# Patient Record
Sex: Female | Born: 1970 | Race: White | Hispanic: No | Marital: Single | Smoking: Former smoker
Health system: Southern US, Community
[De-identification: ages and names within clinical notes are randomized; demographics above are authoritative.]

## PROBLEM LIST (undated history)

## (undated) DIAGNOSIS — I10 Essential (primary) hypertension: Secondary | ICD-10-CM

## (undated) DIAGNOSIS — Z87442 Personal history of urinary calculi: Secondary | ICD-10-CM

## (undated) DIAGNOSIS — N200 Calculus of kidney: Secondary | ICD-10-CM

## (undated) DIAGNOSIS — E785 Hyperlipidemia, unspecified: Secondary | ICD-10-CM

## (undated) DIAGNOSIS — M1990 Unspecified osteoarthritis, unspecified site: Secondary | ICD-10-CM

## (undated) DIAGNOSIS — F419 Anxiety disorder, unspecified: Secondary | ICD-10-CM

## (undated) DIAGNOSIS — T7840XA Allergy, unspecified, initial encounter: Secondary | ICD-10-CM

## (undated) DIAGNOSIS — F329 Major depressive disorder, single episode, unspecified: Secondary | ICD-10-CM

## (undated) DIAGNOSIS — F32A Depression, unspecified: Secondary | ICD-10-CM

## (undated) HISTORY — DX: Depression, unspecified: F32.A

## (undated) HISTORY — DX: Calculus of kidney: N20.0

## (undated) HISTORY — DX: Allergy, unspecified, initial encounter: T78.40XA

## (undated) HISTORY — DX: Essential (primary) hypertension: I10

## (undated) HISTORY — DX: Anxiety disorder, unspecified: F41.9

## (undated) HISTORY — DX: Unspecified osteoarthritis, unspecified site: M19.90

## (undated) HISTORY — PX: TONSILLECTOMY: SUR1361

## (undated) HISTORY — PX: APPENDECTOMY: SHX54

## (undated) HISTORY — DX: Hyperlipidemia, unspecified: E78.5

## (undated) HISTORY — PX: WISDOM TOOTH EXTRACTION: SHX21

## (undated) HISTORY — DX: Major depressive disorder, single episode, unspecified: F32.9

---

## 2008-03-25 LAB — CONVERTED CEMR LAB: Pap Smear: NORMAL

## 2009-04-02 ENCOUNTER — Ambulatory Visit: Payer: Self-pay | Admitting: Family Medicine

## 2009-04-02 DIAGNOSIS — F329 Major depressive disorder, single episode, unspecified: Secondary | ICD-10-CM

## 2009-04-02 DIAGNOSIS — E785 Hyperlipidemia, unspecified: Secondary | ICD-10-CM | POA: Insufficient documentation

## 2009-06-16 ENCOUNTER — Ambulatory Visit: Payer: Self-pay | Admitting: Family Medicine

## 2009-06-16 DIAGNOSIS — R209 Unspecified disturbances of skin sensation: Secondary | ICD-10-CM | POA: Insufficient documentation

## 2010-03-18 ENCOUNTER — Ambulatory Visit: Payer: Self-pay | Admitting: Family Medicine

## 2010-03-18 ENCOUNTER — Other Ambulatory Visit: Admission: RE | Admit: 2010-03-18 | Discharge: 2010-03-18 | Payer: Self-pay | Admitting: Family Medicine

## 2010-03-18 DIAGNOSIS — G4700 Insomnia, unspecified: Secondary | ICD-10-CM | POA: Insufficient documentation

## 2010-03-18 LAB — HM PAP SMEAR

## 2010-11-15 ENCOUNTER — Encounter: Payer: Self-pay | Admitting: Family Medicine

## 2010-11-24 NOTE — Assessment & Plan Note (Signed)
Summary: cpx/pap/pt coming in fasting/cjr   Vital Signs:  Patient profile:   40 year old female Menstrual status:  regular LMP:     03/11/2010 Height:      66 inches Weight:      240 pounds BMI:     38.88 Temp:     98.3 degrees F oral BP sitting:   130 / 88  (left arm) Cuff size:   large  Vitals Entered By: Sid Falcon LPN (Mar 18, 2010 9:01 AM)  Nutrition Counseling: Patient's BMI is greater than 25 and therefore counseled on weight management options. CC: CPX, pap, refills LMP (date): 03/11/2010     Menstrual Status regular Enter LMP: 03/11/2010 Last PAP Result normal   History of Present Illness: Pt here for CPE.  Last pap 2 years ago.  No hx of abnormality.  Tetanus 2010. Remains on OCPs.  No hx DVT.  Quit smoking last year.  No regular exercise. Needs repeat mammogram.-baseline at 34. FH and SH reviewed.  Increased job stress with poor sleep and increased fatigue.  One week cold sxs of nasal cong, cough, and malaise.  Prefers to wait on labs at this time and  would like to schedule these.  Allergies: 1)  Codeine Sulfate (Codeine Sulfate)  Past History:  Past Surgical History: Last updated: 04/02/2009 Appendectomy Tonsillectomy  Family History: Last updated: 03/18/2010 Family History of Arthritis Family History High cholesterol Family History Hypertension Family history Breast cancer, aunt Family history heart disease MGF CAD 84s Family history diabetes, Father  Social History: Last updated: 03/18/2010 Occupation: IT Divorced Current Smoker Alcohol use-yes  occ.  Risk Factors: Smoking Status: current (04/02/2009)  Past Medical History: Depression Hyperlipidemia Hypertension  Family History: Family History of Arthritis Family History High cholesterol Family History Hypertension Family history Breast cancer, aunt Family history heart disease MGF CAD 59s Family history diabetes, Father  Social History: Occupation:  IT Divorced Current Smoker Alcohol use-yes  occ.  Review of Systems  The patient denies anorexia, fever, weight loss, weight gain, vision loss, decreased hearing, chest pain, syncope, dyspnea on exertion, peripheral edema, prolonged cough, headaches, hemoptysis, abdominal pain, melena, hematochezia, severe indigestion/heartburn, hematuria, incontinence, genital sores, muscle weakness, suspicious skin lesions, enlarged lymph nodes, and breast masses.    Physical Exam  General:  Well-developed,well-nourished,in no acute distress; alert,appropriate and cooperative throughout examination Head:  Normocephalic and atraumatic without obvious abnormalities. No apparent alopecia or balding. Eyes:  No corneal or conjunctival inflammation noted. EOMI. Perrla. Funduscopic exam benign, without hemorrhages, exudates or papilledema. Vision grossly normal. Ears:  External ear exam shows no significant lesions or deformities.  Otoscopic examination reveals clear canals, tympanic membranes are intact bilaterally without bulging, retraction, inflammation or discharge. Hearing is grossly normal bilaterally. Mouth:  Oral mucosa and oropharynx without lesions or exudates.  Teeth in good repair. Neck:  No deformities, masses, or tenderness noted. Chest Wall:  No deformities, masses, or tenderness noted. Breasts:  No mass, nodules, thickening, tenderness, bulging, retraction, inflamation, nipple discharge or skin changes noted.   Lungs:  Normal respiratory effort, chest expands symmetrically. Lungs are clear to auscultation, no crackles or wheezes. Heart:  Normal rate and regular rhythm. S1 and S2 normal without gallop, murmur, click, rub or other extra sounds. Abdomen:  Bowel sounds positive,abdomen soft and non-tender without masses, organomegaly or hernias noted. Genitalia:  Normal introitus for age, no external lesions, no vaginal discharge, mucosa pink and moist, no vaginal or cervical lesions, no vaginal  atrophy, no friaility or hemorrhage, normal  uterus size and position, no adnexal masses or tenderness Extremities:  no edema. Neurologic:  alert & oriented X3, cranial nerves II-XII intact, and strength normal in all extremities.   Skin:  no rashes and no suspicious lesions.   Cervical Nodes:  No lymphadenopathy noted Psych:  good eye contact, not anxious appearing, and not depressed appearing.     Impression & Recommendations:  Problem # 1:  Preventive Health Care (ICD-V70.0) worl on weight loss.  Pap smear.  Schedule labs per pt request.  Mammogram.  Problem # 2:  INSOMNIA, CHRONIC (ICD-307.42) sleep hygiene discussed.  Short term as needed use of lorazepam as needed.  Complete Medication List: 1)  Prozac 40 Mg Caps (Fluoxetine hcl) .... Once daily 2)  Ortho-novum 1/35 (28) 1-35 Mg-mcg Tabs (Norethindrone-eth estradiol) .... Daily as directed 3)  Zyrtec Allergy 10 Mg Tabs (Cetirizine hcl) .... Once daily as needed 4)  Womens One Daily Tabs (Multiple vitamins-minerals) .... Once daily 5)  Lorazepam 0.5 Mg Tabs (Lorazepam) .... One by mouth q hs prn  Other Orders: Pap Smear, Thin Prep ( Collection of) ([id number])  Patient Instructions: 1)  It is important that you exercise reguarly at least 20 minutes 5 times a week. If you develop chest pain, have severe difficulty breathing, or feel very tired, stop exercising immediately and seek medical attention.  2)  You need to lose weight. Consider a lower calorie diet and regular exercise.  3)  Schedule your mammogram.  4)  Schedule the following labs:  lipid, bmp, hepatic, TSH, CBC (V70.0) Prescriptions: PROZAC 40 MG CAPS (FLUOXETINE HCL) once daily  #30 x 11   Entered and Authorized by:   Evelena Peat MD   Signed by:   Evelena Peat MD on 03/18/2010   Method used:   Electronically to        [facility] (retail)       [address]       [place], [place]  [address]       Ph: [phone]        Fax: [phone]   RxID:   [id number] ORTHO-NOVUM 1/35 (28) 1-35 MG-MCG TABS (NORETHINDRONE-ETH ESTRADIOL) daily as directed  #28 Tablet x 11   Entered and Authorized by:   Evelena Peat MD   Signed by:   Evelena Peat MD on 03/18/2010   Method used:   Electronically to        [facility] ([facility])       [address]       [place], [place]  [address]       Ph: [phone]       Fax: [phone]   RxID:   [id number] LORAZEPAM 0.5 MG TABS (LORAZEPAM) one by mouth q hs prn  #30 x 3   Entered and Authorized by:   Evelena Peat MD   Signed by:   Evelena Peat MD on 03/18/2010   Method used:   Print then Give to Patient   RxID:   [id number]

## 2010-12-31 ENCOUNTER — Other Ambulatory Visit (HOSPITAL_COMMUNITY): Payer: Self-pay | Admitting: Obstetrics & Gynecology

## 2010-12-31 DIAGNOSIS — Z1231 Encounter for screening mammogram for malignant neoplasm of breast: Secondary | ICD-10-CM

## 2011-01-07 ENCOUNTER — Ambulatory Visit (HOSPITAL_COMMUNITY): Payer: Managed Care, Other (non HMO)

## 2011-01-18 ENCOUNTER — Ambulatory Visit (HOSPITAL_COMMUNITY)
Admission: RE | Admit: 2011-01-18 | Discharge: 2011-01-18 | Disposition: A | Discharge status: Home / Self Care | Payer: Managed Care, Other (non HMO) | Source: Ambulatory Visit | Attending: Obstetrics & Gynecology | Admitting: Obstetrics & Gynecology

## 2011-01-18 DIAGNOSIS — Z1231 Encounter for screening mammogram for malignant neoplasm of breast: Secondary | ICD-10-CM | POA: Insufficient documentation

## 2011-04-05 ENCOUNTER — Other Ambulatory Visit: Payer: Self-pay | Admitting: Family Medicine

## 2011-04-06 ENCOUNTER — Telehealth: Payer: Self-pay | Admitting: *Deleted

## 2011-04-12 NOTE — Telephone Encounter (Signed)
Opened in error

## 2011-04-27 ENCOUNTER — Encounter: Payer: Self-pay | Admitting: Family Medicine

## 2011-04-29 ENCOUNTER — Ambulatory Visit: Payer: Managed Care, Other (non HMO) | Admitting: Family Medicine

## 2011-05-10 ENCOUNTER — Ambulatory Visit (INDEPENDENT_AMBULATORY_CARE_PROVIDER_SITE_OTHER): Payer: BC Managed Care – PPO | Admitting: Family Medicine

## 2011-05-10 ENCOUNTER — Encounter: Payer: Self-pay | Admitting: Family Medicine

## 2011-05-10 VITALS — BP 114/78 | Temp 98.0°F | Ht 66.5 in | Wt 244.0 lb

## 2011-05-10 DIAGNOSIS — F411 Generalized anxiety disorder: Secondary | ICD-10-CM

## 2011-05-10 DIAGNOSIS — F419 Anxiety disorder, unspecified: Secondary | ICD-10-CM

## 2011-05-10 MED ORDER — FLUOXETINE HCL 40 MG PO CAPS: 40.0000 mg | ORAL_CAPSULE | Freq: Every day | ORAL | Status: DC

## 2011-05-10 NOTE — Progress Notes (Signed)
  Subjective:    Patient ID: Gina Vargas, female    DOB: [birth year], 40 y.o.   MRN: [id number]  HPI Medication followup. Fluoxetine 40 mg daily. Has been taking this for 3-4 years. Initially started more for anxiety issues but did have some depressive issues as well. Change of job during the past year and this has reduced her stress levels. She is reluctant to stop fluoxetine. She does not take lorazepam at this time. Recent IUD placement per her gynecologist and this helped regulate her periods.   Review of Systems  Constitutional: Negative for activity change, appetite change and unexpected weight change.  Respiratory: Negative for shortness of breath.   Cardiovascular: Negative for chest pain.  Psychiatric/Behavioral: Negative for confusion, sleep disturbance, dysphoric mood and agitation.       Objective:   Physical Exam  Constitutional: She appears well-developed and well-nourished. No distress.  Cardiovascular: Normal rate, regular rhythm and normal heart sounds.   No murmur heard. Pulmonary/Chest: Effort normal and breath sounds normal. No respiratory distress. She has no wheezes. She has no rales.  Psychiatric: She has a normal mood and affect. Her behavior is normal.          Assessment & Plan:  Chronic anxiety. Stable on fluoxetine 40 mg daily. Discussed possible tapering and patient is reluctant. Medication refilled for one year

## 2011-05-24 ENCOUNTER — Ambulatory Visit (INDEPENDENT_AMBULATORY_CARE_PROVIDER_SITE_OTHER): Payer: BC Managed Care – PPO | Admitting: Family Medicine

## 2011-05-24 ENCOUNTER — Encounter: Payer: Self-pay | Admitting: Family Medicine

## 2011-05-24 VITALS — BP 120/86 | Temp 97.6°F | Wt 243.0 lb

## 2011-05-24 DIAGNOSIS — J019 Acute sinusitis, unspecified: Secondary | ICD-10-CM

## 2011-05-24 MED ORDER — AMOXICILLIN-POT CLAVULANATE 875-125 MG PO TABS: 1.0000 | ORAL_TABLET | Freq: Two times a day (BID) | ORAL | Status: AC

## 2011-05-24 NOTE — Progress Notes (Signed)
  Subjective:    Patient ID: Gina Vargas, female    DOB: 11/24/1970, 40 y.o.   MRN: [id number]  HPI Three-week history of intermittent sinus pressure and malaise. Intermittent sore throat. Cough productive of green sputum. She has bilateral ear pressure. History of frequent otitis media in past. Occasional teeth pain. No relief with over-the-counter medications. Occasional transient vertigo symptoms. No syncope. No orthostasis. Denies any nausea, vomiting, or diarrhea. Has history of frequent allergies and takes Zyrtec regularly   Review of Systems  Constitutional: Positive for fatigue. Negative for fever and chills.  HENT: Positive for congestion, sore throat and sinus pressure. Negative for drooling and mouth sores.   Respiratory: Negative for cough and shortness of breath.   Cardiovascular: Negative for chest pain.       Objective:   Physical Exam  Constitutional: She appears well-developed and well-nourished.  HENT:  Right Ear: External ear normal.  Left Ear: External ear normal.  Mouth/Throat: Oropharynx is clear and moist. No oropharyngeal exudate.       Bilateral turbinate swelling but no purulent discharge  Neck: Neck supple.  Cardiovascular: Normal rate and regular rhythm.   Pulmonary/Chest: Effort normal and breath sounds normal. No respiratory distress. She has no wheezes. She has no rales.  Lymphadenopathy:    She has no cervical adenopathy.          Assessment & Plan:  Acute sinusitis. Augmentin 875 mg twice daily for 10 days No problem-specific assessment & plan notes found for this encounter.

## 2011-05-24 NOTE — Patient Instructions (Signed)

## 2011-06-11 ENCOUNTER — Telehealth: Payer: Self-pay | Admitting: *Deleted

## 2011-06-11 MED ORDER — DOXYCYCLINE HYCLATE 100 MG PO TABS: 100.0000 mg | ORAL_TABLET | Freq: Two times a day (BID) | ORAL | Status: AC

## 2011-06-11 MED ORDER — DOXYCYCLINE HYCLATE 100 MG PO TABS: 100.0000 mg | ORAL_TABLET | Freq: Two times a day (BID) | ORAL | Status: DC

## 2011-06-11 NOTE — Telephone Encounter (Signed)
Doxycycline 100 mg  #30 one twice daily

## 2011-06-11 NOTE — Telephone Encounter (Signed)
Notified pt to pick up meds. 

## 2011-06-11 NOTE — Telephone Encounter (Signed)
Pt finished antibiotics, and symptoms of her sinus infection came right back.  Pressure in ears, teeth pain, and post nasal drip (green).  Is asking for another antibiotic.

## 2012-06-30 ENCOUNTER — Other Ambulatory Visit: Payer: Self-pay | Admitting: *Deleted

## 2012-06-30 MED ORDER — FLUOXETINE HCL 40 MG PO CAPS: 40.0000 mg | ORAL_CAPSULE | Freq: Every day | ORAL | Status: DC

## 2012-07-05 ENCOUNTER — Other Ambulatory Visit: Payer: Self-pay | Admitting: *Deleted

## 2012-07-05 MED ORDER — FLUOXETINE HCL 40 MG PO CAPS: 40.0000 mg | ORAL_CAPSULE | Freq: Every day | ORAL | Status: DC

## 2013-01-05 ENCOUNTER — Other Ambulatory Visit (HOSPITAL_COMMUNITY): Payer: Self-pay | Admitting: Obstetrics & Gynecology

## 2013-01-05 DIAGNOSIS — Z1231 Encounter for screening mammogram for malignant neoplasm of breast: Secondary | ICD-10-CM

## 2013-01-16 ENCOUNTER — Ambulatory Visit (HOSPITAL_COMMUNITY)
Admission: RE | Admit: 2013-01-16 | Discharge: 2013-01-16 | Disposition: A | Discharge status: Home / Self Care | Payer: BC Managed Care – PPO | Source: Ambulatory Visit | Attending: Obstetrics & Gynecology | Admitting: Obstetrics & Gynecology

## 2013-01-16 DIAGNOSIS — Z1231 Encounter for screening mammogram for malignant neoplasm of breast: Secondary | ICD-10-CM | POA: Insufficient documentation

## 2013-02-13 ENCOUNTER — Encounter (HOSPITAL_COMMUNITY): Payer: Self-pay

## 2013-02-13 ENCOUNTER — Emergency Department (HOSPITAL_COMMUNITY)
Admission: EM | Admit: 2013-02-13 | Discharge: 2013-02-13 | Disposition: A | Discharge status: Home / Self Care | Payer: BC Managed Care – PPO | Attending: Emergency Medicine | Admitting: Emergency Medicine

## 2013-02-13 DIAGNOSIS — R51 Headache: Secondary | ICD-10-CM | POA: Insufficient documentation

## 2013-02-13 DIAGNOSIS — R05 Cough: Secondary | ICD-10-CM | POA: Insufficient documentation

## 2013-02-13 DIAGNOSIS — J3489 Other specified disorders of nose and nasal sinuses: Secondary | ICD-10-CM | POA: Insufficient documentation

## 2013-02-13 DIAGNOSIS — F329 Major depressive disorder, single episode, unspecified: Secondary | ICD-10-CM | POA: Insufficient documentation

## 2013-02-13 DIAGNOSIS — Z862 Personal history of diseases of the blood and blood-forming organs and certain disorders involving the immune mechanism: Secondary | ICD-10-CM | POA: Insufficient documentation

## 2013-02-13 DIAGNOSIS — Z79899 Other long term (current) drug therapy: Secondary | ICD-10-CM | POA: Insufficient documentation

## 2013-02-13 DIAGNOSIS — R112 Nausea with vomiting, unspecified: Secondary | ICD-10-CM | POA: Insufficient documentation

## 2013-02-13 DIAGNOSIS — R5381 Other malaise: Secondary | ICD-10-CM | POA: Insufficient documentation

## 2013-02-13 DIAGNOSIS — I10 Essential (primary) hypertension: Secondary | ICD-10-CM | POA: Insufficient documentation

## 2013-02-13 DIAGNOSIS — Z8639 Personal history of other endocrine, nutritional and metabolic disease: Secondary | ICD-10-CM | POA: Insufficient documentation

## 2013-02-13 DIAGNOSIS — J069 Acute upper respiratory infection, unspecified: Secondary | ICD-10-CM

## 2013-02-13 DIAGNOSIS — R509 Fever, unspecified: Secondary | ICD-10-CM | POA: Insufficient documentation

## 2013-02-13 DIAGNOSIS — F3289 Other specified depressive episodes: Secondary | ICD-10-CM | POA: Insufficient documentation

## 2013-02-13 DIAGNOSIS — R059 Cough, unspecified: Secondary | ICD-10-CM | POA: Insufficient documentation

## 2013-02-13 MED ORDER — ONDANSETRON 4 MG PO TBDP: 4.0000 mg | ORAL_TABLET | Freq: Three times a day (TID) | ORAL | Status: DC | PRN

## 2013-02-13 MED ORDER — BENZONATATE 100 MG PO CAPS: 200.0000 mg | ORAL_CAPSULE | Freq: Two times a day (BID) | ORAL | Status: DC | PRN

## 2013-02-13 MED ORDER — IBUPROFEN 800 MG PO TABS
800.0000 mg | ORAL_TABLET | Freq: Once | ORAL | Status: AC
  Administered 2013-02-13: 800 mg via ORAL
  Filled 2013-02-13: qty 1

## 2013-02-13 NOTE — ED Notes (Signed)
Pt c/o back pain, nausea and vomiting. Pt has been ill for several days.

## 2013-02-13 NOTE — ED Provider Notes (Signed)
History     CSN: [id number]  Arrival date & time 02/13/13  2103   First MD Initiated Contact with Patient 02/13/13 2157      Chief Complaint  Patient presents with  . Back Pain    (Consider location/radiation/quality/duration/timing/severity/associated sxs/prior treatment) HPI Comments: 42 y.o. Female with PMHx of HTN, HLD, and seasonal allergies presents with "head cold" symptoms (cough, sinus congestion with associated frontal headache, malaise) that started Monday after spending the weekend with her mom who also had a head cold. Pt states she didn't feel well going to work and gradually worsened by the end of the day when pt felt chilled, feverish, and achy all over (including her lower back). Pt tried Nyquil, Alka Seltzer Cold and Flu, Afrin, and Tylenol. Each provided temporary relief, but sx returned. Today, she woke up with increased malaise, aches, and chills, adding nausea to sx. Normal bowel movements. About 8pm today, pt was afraid she had the flu and wondered if she needed a tamiflu shot so came to what she thought was a walk-in. While here in the ED, pt vomited and stated she felt better after vomiting.   Denies neurological deficits, loss of bowel or bladder control, hx of cancer. Denies sore throat, ear pain, abdominal pain, chest pain, dyspnea, dizziness.   Patient is a 42 y.o. female presenting with back pain.  Back Pain Associated symptoms: fever, headaches and weakness   Associated symptoms: no abdominal pain, no chest pain, no dysuria and no numbness     Past Medical History  Diagnosis Date  . Depression   . Hyperlipidemia   . Hypertension     Past Surgical History  Procedure Laterality Date  . Appendectomy    . Tonsillectomy      Family History  Problem Relation Age of Onset  . Arthritis      fhx  . Hyperlipidemia      fhx  . Hypertension      fhx  . Cancer      fhx  . Coronary artery disease Maternal Grandfather   . Diabetes Father   . Heart  disease      fhx    History  Substance Use Topics  . Smoking status: Never Smoker   . Smokeless tobacco: Not on file  . Alcohol Use: Yes    OB History   Grav Para Term Preterm Abortions TAB SAB Ect Mult Living                  Review of Systems  Constitutional: Positive for fever, chills, appetite change and fatigue. Negative for diaphoresis.  HENT: Positive for sinus pressure. Negative for sore throat, neck pain and neck stiffness.   Eyes: Negative for visual disturbance.  Respiratory: Positive for cough. Negative for apnea, chest tightness and shortness of breath.        Wet cough  Cardiovascular: Negative for chest pain and palpitations.  Gastrointestinal: Positive for nausea and vomiting. Negative for abdominal pain, diarrhea and constipation.       Vomiting 1x, non bloody, non billious  Genitourinary: Negative for dysuria, hematuria and vaginal discharge.  Musculoskeletal: Positive for back pain. Negative for gait problem.  Skin: Negative for rash.  Neurological: Positive for weakness and headaches. Negative for dizziness, light-headedness and numbness.       Frontal headache    Allergies  Codeine sulfate  Home Medications   Current Outpatient Rx  Name  Route  Sig  Dispense  Refill  . cetirizine (ZYRTEC)  10 MG tablet   Oral   Take 10 mg by mouth daily.           [facility] FLUoxetine (PROZAC) 40 MG capsule   Oral   Take 1 capsule (40 mg total) by mouth daily.   30 capsule   0     Pt needs return OV before any more refills, please ...   . Multiple Vitamin (MULTIVITAMIN) tablet   Oral   Take 1 tablet by mouth daily.             BP 137/87  Pulse 115  Resp 24  SpO2 98%  Physical Exam  Nursing note and vitals reviewed. Constitutional: She is oriented to person, place, and time. She appears well-developed and well-nourished. No distress.  HENT:  Head: Normocephalic and atraumatic.  Right Ear: Tympanic membrane normal.  Left Ear: Tympanic membrane normal.   Nose: No sinus tenderness. Right sinus exhibits no maxillary sinus tenderness and no frontal sinus tenderness. Left sinus exhibits no maxillary sinus tenderness and no frontal sinus tenderness.  Mouth/Throat: No oropharyngeal exudate, posterior oropharyngeal edema or posterior oropharyngeal erythema.  Eyes: Conjunctivae and EOM are normal.  Neck: Normal range of motion. Neck supple.  No meningeal signs  Cardiovascular: Normal rate, regular rhythm and normal heart sounds.  Exam reveals no gallop and no friction rub.   No murmur heard. Pulmonary/Chest: Effort normal and breath sounds normal. No respiratory distress. She has no wheezes. She has no rales. She exhibits no tenderness.  Abdominal: Soft. Bowel sounds are normal. She exhibits no distension. There is no tenderness. There is no rebound and no guarding.  Musculoskeletal: Normal range of motion. She exhibits no edema and no tenderness.  Strength 5/5 throughout. Good grip strength. No step offs. No tenderness to midline or to paraspinous muscles.   Neurological: She is alert and oriented to person, place, and time. No cranial nerve deficit.  No focal deficits.   Skin: Skin is warm and dry. She is not diaphoretic. No erythema.  Psychiatric: She has a normal mood and affect.    ED Course  Procedures (including critical care time) Medications  ibuprofen (ADVIL,MOTRIN) tablet 800 mg (800 mg Oral Given 02/13/13 2254)  BP 129/81  Pulse 84  Temp(Src) 99.5 F (37.5 C) (Oral)  Resp 14  SpO2 97%    Labs Reviewed - No data to display No results found.   1. Upper respiratory infection       MDM  42 y.o. Female with PMHx of HTN, HLD, and seasonal allergies presents with "head cold" symptoms (cough, sinus congestion with associated frontal headache, malaise). Physical exam benign for bacterial infection: lungs CTA, TMs normal, throat non-erythematous with no exudates, nares patent without edema. No sinus tenderness on percussion. FROM  extremities and spine. Denies neurological deficits, loss of bowel or bladder control, hx of cancer. Denies sore throat, ear pain, abdominal pain, chest pain, dyspnea, dizziness. Patients symptoms are consistent with URI, likely viral etiology.  11:35 PM On re-evaluation, pt feeling better with administration of ibuprofen. Vitals are WNL, temp decreased to 99.5. Discussed with pt that antibiotics are not indicated for viral infections. Pt will be discharged with symptomatic treatment.  Verbalizes understanding and is agreeable with plan. Pt is hemodynamically stable & in NAD prior to dc.  Glade Nurse, PA-C 02/13/13 2342

## 2013-02-15 NOTE — ED Provider Notes (Signed)
Medical screening examination/treatment/procedure(s) were performed by non-physician practitioner and as supervising physician I was immediately available for consultation/collaboration.   Gwyneth Sprout, MD 02/15/13 2236

## 2013-07-13 ENCOUNTER — Other Ambulatory Visit: Payer: Self-pay | Admitting: Family Medicine

## 2013-07-23 ENCOUNTER — Other Ambulatory Visit: Payer: Self-pay | Admitting: Family Medicine

## 2013-07-26 ENCOUNTER — Encounter: Payer: Self-pay | Admitting: Family Medicine

## 2013-07-26 ENCOUNTER — Ambulatory Visit (INDEPENDENT_AMBULATORY_CARE_PROVIDER_SITE_OTHER): Payer: BC Managed Care – PPO | Admitting: Family Medicine

## 2013-07-26 VITALS — BP 128/84 | HR 77 | Temp 98.2°F | Wt 265.0 lb

## 2013-07-26 DIAGNOSIS — F329 Major depressive disorder, single episode, unspecified: Secondary | ICD-10-CM

## 2013-07-26 DIAGNOSIS — F3289 Other specified depressive episodes: Secondary | ICD-10-CM

## 2013-07-26 MED ORDER — FLUOXETINE HCL 40 MG PO CAPS: 40.0000 mg | ORAL_CAPSULE | Freq: Every day | ORAL | Status: DC

## 2013-07-26 NOTE — Progress Notes (Signed)
  Subjective:    Patient ID: Gina Vargas, female    DOB: [birth year], 42 y.o.   MRN: [id number]  HPI Patient here for followup regarding chronic and recurrent depression. She takes Prozac 40 mg daily has been on this for several years. Over time, she has tried tapering this back multiple occasions without success. She has lots of job stress. For the most part she is sleeping well. She realizes that she needs to lose some weight. She's had good interest in activities and denies any consistent depressed mood. Denies any medication side effects.  Past Medical History  Diagnosis Date  . Depression   . Hyperlipidemia   . Hypertension    Past Surgical History  Procedure Laterality Date  . Appendectomy    . Tonsillectomy      reports that she has never smoked. She does not have any smokeless tobacco history on file. She reports that  drinks alcohol. Her drug history is not on file. family history includes Arthritis in an other family member; Cancer in an other family member; Coronary artery disease in her maternal grandfather; Diabetes in her father; Heart disease in an other family member; Hyperlipidemia in an other family member; Hypertension in an other family member. Allergies  Allergen Reactions  . Codeine Sulfate     REACTION: anxiety      Review of Systems  Constitutional: Negative for appetite change and unexpected weight change.  Respiratory: Negative for shortness of breath.   Cardiovascular: Negative for chest pain.  Psychiatric/Behavioral: Positive for sleep disturbance and dysphoric mood. Negative for suicidal ideas and agitation.       Objective:   Physical Exam  Constitutional: She appears well-developed and well-nourished.  Neck: Neck supple. No thyromegaly present.  Cardiovascular: Normal rate and regular rhythm.   Pulmonary/Chest: Effort normal and breath sounds normal. No respiratory distress. She has no wheezes. She has no rales.  Psychiatric: She has a normal  mood and affect. Her behavior is normal.          Assessment & Plan:  History of recurrent depression. Currently stable. Refill Prozac for one year. We discussed the importance of weight loss. Flu vaccine offered and declined

## 2014-04-02 ENCOUNTER — Other Ambulatory Visit (HOSPITAL_COMMUNITY): Payer: Self-pay | Admitting: Obstetrics & Gynecology

## 2014-04-02 DIAGNOSIS — Z1231 Encounter for screening mammogram for malignant neoplasm of breast: Secondary | ICD-10-CM

## 2014-04-16 ENCOUNTER — Ambulatory Visit (HOSPITAL_COMMUNITY)
Admission: RE | Admit: 2014-04-16 | Discharge: 2014-04-16 | Disposition: A | Discharge status: Home / Self Care | Payer: Managed Care, Other (non HMO) | Source: Ambulatory Visit | Attending: Obstetrics & Gynecology | Admitting: Obstetrics & Gynecology

## 2014-04-16 DIAGNOSIS — Z1231 Encounter for screening mammogram for malignant neoplasm of breast: Secondary | ICD-10-CM | POA: Insufficient documentation

## 2014-08-14 ENCOUNTER — Other Ambulatory Visit: Payer: Self-pay | Admitting: Family Medicine

## 2014-08-16 ENCOUNTER — Telehealth: Payer: Self-pay | Admitting: Family Medicine

## 2014-08-16 MED ORDER — FLUOXETINE HCL 40 MG PO CAPS: 40.0000 mg | ORAL_CAPSULE | Freq: Every day | ORAL | Status: DC

## 2014-08-16 NOTE — Telephone Encounter (Signed)
Rx sent to pharmacy   

## 2014-08-16 NOTE — Telephone Encounter (Signed)
Pt request refill FLUoxetine (PROZAC) 40 MG capsule Pt knows she needs appt and made appt for 11/13, Can you send in 30 days to get her through? [facility]

## 2014-09-06 ENCOUNTER — Encounter: Payer: Self-pay | Admitting: Family Medicine

## 2014-09-06 ENCOUNTER — Ambulatory Visit (INDEPENDENT_AMBULATORY_CARE_PROVIDER_SITE_OTHER): Payer: Managed Care, Other (non HMO) | Admitting: Family Medicine

## 2014-09-06 VITALS — BP 124/84 | HR 64 | Temp 98.2°F | Wt 243.0 lb

## 2014-09-06 DIAGNOSIS — F32A Depression, unspecified: Secondary | ICD-10-CM

## 2014-09-06 DIAGNOSIS — F329 Major depressive disorder, single episode, unspecified: Secondary | ICD-10-CM

## 2014-09-06 DIAGNOSIS — J069 Acute upper respiratory infection, unspecified: Secondary | ICD-10-CM

## 2014-09-06 MED ORDER — FLUOXETINE HCL 40 MG PO CAPS: 40.0000 mg | ORAL_CAPSULE | Freq: Every day | ORAL | Status: DC

## 2014-09-06 NOTE — Patient Instructions (Signed)
Consider complete physical this year.  

## 2014-09-06 NOTE — Progress Notes (Signed)
Pre visit review using our clinic review tool, if applicable. No additional management support is needed unless otherwise documented below in the visit note. 

## 2014-09-06 NOTE — Progress Notes (Signed)
   Subjective:    Patient ID: Gina Vargas, female    DOB: [birth year], 43 y.o.   MRN: [id number]  HPI Patient here for follow-up regarding chronic depression. She's had recurrent depression which is stable for several years on Prozac 40 mg daily. Needing refills. Depression symptoms are stable.  She has acute symptoms of few day history of nasal congestion mild sore throat. Occasional cough. She does have perennial allergies and takes Zyrtec regularly. She is not sure if this represents allergy versus cold. She denies any fever.  Brother just diagnosed with type 2 diabetes. Patient has lost about 20 pounds that she repeat a she's not had a physical in some time.  Past Medical History  Diagnosis Date  . Depression   . Hyperlipidemia   . Hypertension    Past Surgical History  Procedure Laterality Date  . Appendectomy    . Tonsillectomy      reports that she has never smoked. She does not have any smokeless tobacco history on file. She reports that she drinks alcohol. Her drug history is not on file. family history includes Arthritis in an other family member; Cancer in an other family member; Coronary artery disease in her maternal grandfather; Diabetes in her father; Heart disease in an other family member; Hyperlipidemia in an other family member; Hypertension in an other family member. Allergies  Allergen Reactions  . Codeine Sulfate     REACTION: anxiety      Review of Systems  Constitutional: Negative for fatigue.  Eyes: Negative for visual disturbance.  Respiratory: Negative for cough, chest tightness, shortness of breath and wheezing.   Cardiovascular: Negative for chest pain, palpitations and leg swelling.  Endocrine: Negative for polydipsia and polyuria.  Neurological: Negative for dizziness, seizures, syncope, weakness, light-headedness and headaches.       Objective:   Physical Exam  Constitutional: She appears well-developed and well-nourished.  HENT:  Right  Ear: External ear normal.  Left Ear: External ear normal.  Mouth/Throat: Oropharynx is clear and moist.  Neck: Neck supple.  Cardiovascular: Normal rate and regular rhythm.   Pulmonary/Chest: Effort normal and breath sounds normal. No respiratory distress. She has no wheezes. She has no rales.          Assessment & Plan:  #1 history of recurrent depression. Stable. Refill fluoxetine for one year #2 probable viral URI versus allergic rhinitis. She'll continue Zyrtec and add Sudafed. Consider over-the-counter Nasacort or Flonase #3 health maintenance. She is encouraged to schedule complete physical with fasting lab work

## 2014-09-17 ENCOUNTER — Telehealth: Payer: Self-pay | Admitting: Family Medicine

## 2014-09-17 NOTE — Telephone Encounter (Signed)
Pt states she is having some anxiety,  Pt has broken up w/ boyfriend of 6 yrs. Not doing so well.  Pt refused to speak w/ triage, prefers to wait to speak w/ you. Pt aware this is dr's half day and would appreciate this addressed first thing in the am. Pt has been on FLUoxetine (PROZAC) 40 MG capsule long term and would like something else. Pt refused appt b/c she states she was just in 11/13  and has a cpe on 12/7. pls advise

## 2014-09-18 MED ORDER — ALPRAZOLAM 0.5 MG PO TABS: 0.5000 mg | ORAL_TABLET | Freq: Every evening | ORAL | Status: DC | PRN

## 2014-09-18 NOTE — Telephone Encounter (Signed)
Spoke with patient.  Recently broke up with her boyfriend of 6 years. She has history of chronic anxiety and takes Prozac 40 mg daily. She has periods where she has extreme shakiness and sometimes has panic type attacks. She is in the process of setting up counseling. We agreed only very limited alprazolam 0.5 mg #30 ([facility])with no refill until follow-up. She will use this very sparingly for severe episodes.

## 2014-09-18 NOTE — Telephone Encounter (Signed)
Pt is calling back. Pt is aware waiting on MD

## 2014-09-18 NOTE — Telephone Encounter (Signed)
Rx called in to pharmacy. 

## 2014-09-23 ENCOUNTER — Other Ambulatory Visit (INDEPENDENT_AMBULATORY_CARE_PROVIDER_SITE_OTHER): Payer: Managed Care, Other (non HMO)

## 2014-09-23 DIAGNOSIS — Z0000 Encounter for general adult medical examination without abnormal findings: Secondary | ICD-10-CM

## 2014-09-23 LAB — POCT URINALYSIS DIPSTICK
Bilirubin, UA: NEGATIVE
Glucose, UA: NEGATIVE
KETONES UA: NEGATIVE
LEUKOCYTES UA: NEGATIVE
NITRITE UA: NEGATIVE
PH UA: 6
PROTEIN UA: NEGATIVE
RBC UA: NEGATIVE
Spec Grav, UA: <=1.005
Urobilinogen, UA: 0.2

## 2014-09-23 LAB — BASIC METABOLIC PANEL
BUN: 15 mg/dL (ref 6–23)
CO2: 22 mEq/L (ref 19–32)
Calcium: 9.3 mg/dL (ref 8.4–10.5)
Chloride: 102 mEq/L (ref 96–112)
Creatinine, Ser: 1 mg/dL (ref 0.4–1.2)
GFR: 65.29 mL/min (ref >60.00)
Glucose, Bld: 102 mg/dL — ABNORMAL HIGH (ref 70–99)
POTASSIUM: 3.9 meq/L (ref 3.5–5.1)
SODIUM: 135 meq/L (ref 135–145)

## 2014-09-23 LAB — CBC WITH DIFFERENTIAL/PLATELET
Basophils Absolute: 0.1 10*3/uL (ref 0.0–0.1)
Basophils Relative: 0.4 % (ref 0.0–3.0)
EOS PCT: 2 % (ref 0.0–5.0)
Eosinophils Absolute: 0.2 10*3/uL (ref 0.0–0.7)
HEMATOCRIT: 45.9 % (ref 36.0–46.0)
HEMOGLOBIN: 14.9 g/dL (ref 12.0–15.0)
LYMPHS ABS: 2.5 10*3/uL (ref 0.7–4.0)
Lymphocytes Relative: 21.7 % (ref 12.0–46.0)
MCHC: 32.4 g/dL (ref 30.0–36.0)
MCV: 89.4 fl (ref 78.0–100.0)
MONOS PCT: 5.5 % (ref 3.0–12.0)
Monocytes Absolute: 0.6 10*3/uL (ref 0.1–1.0)
NEUTROS ABS: 8.2 10*3/uL — AB (ref 1.4–7.7)
Neutrophils Relative %: 70.4 % (ref 43.0–77.0)
Platelets: 335 10*3/uL (ref 150.0–400.0)
RBC: 5.13 Mil/uL — AB (ref 3.87–5.11)
RDW: 14 % (ref 11.5–15.5)
WBC: 11.7 10*3/uL — AB (ref 4.0–10.5)

## 2014-09-23 LAB — HEPATIC FUNCTION PANEL
ALBUMIN: 4.4 g/dL (ref 3.5–5.2)
ALK PHOS: 90 U/L (ref 39–117)
ALT: 24 U/L (ref 0–35)
AST: 20 U/L (ref 0–37)
Bilirubin, Direct: 0.1 mg/dL (ref 0.0–0.3)
TOTAL PROTEIN: 7.5 g/dL (ref 6.0–8.3)
Total Bilirubin: 0.7 mg/dL (ref 0.2–1.2)

## 2014-09-23 LAB — LIPID PANEL
Cholesterol: 200 mg/dL (ref 0–200)
HDL: 38.3 mg/dL — ABNORMAL LOW (ref >39.00)
LDL CALC: 134 mg/dL — AB (ref 0–99)
NONHDL: 161.7
Total CHOL/HDL Ratio: 5
Triglycerides: 139 mg/dL (ref 0.0–149.0)
VLDL: 27.8 mg/dL (ref 0.0–40.0)

## 2014-09-23 LAB — TSH: TSH: 2.2 u[IU]/mL (ref 0.35–4.50)

## 2014-09-30 ENCOUNTER — Ambulatory Visit (INDEPENDENT_AMBULATORY_CARE_PROVIDER_SITE_OTHER): Payer: Managed Care, Other (non HMO) | Admitting: Family Medicine

## 2014-09-30 ENCOUNTER — Encounter: Payer: Self-pay | Admitting: Family Medicine

## 2014-09-30 VITALS — BP 126/84 | HR 71 | Temp 98.6°F | Ht 66.0 in | Wt 230.0 lb

## 2014-09-30 DIAGNOSIS — E669 Obesity, unspecified: Secondary | ICD-10-CM

## 2014-09-30 DIAGNOSIS — Z0000 Encounter for general adult medical examination without abnormal findings: Secondary | ICD-10-CM

## 2014-09-30 NOTE — Progress Notes (Signed)
Pre visit review using our clinic review tool, if applicable. No additional management support is needed unless otherwise documented below in the visit note. 

## 2014-09-30 NOTE — Patient Instructions (Signed)

## 2014-09-30 NOTE — Progress Notes (Signed)
Subjective:    Patient ID: Gina Vargas, female    DOB: November 21, 1970, 43 y.o.   MRN: [id number]  HPI Patient seen for complete physical. She sees gynecologist regularly. She's had some recent stress issues with having some issues with her boyfriend of 6 years. They are seeking counseling. Overall she is doing fairly well. She does have history of depression and is on Prozac and that is relatively stable. Her brother was diagnosed with type 2 diabetes in the past year. She is getting Pap smears through GYN. She has lost about 20 pounds due to her efforts this year. No consistent exercise but has scaled back calories. Tetanus up-to-date. She declines flu vaccine  Past Medical History  Diagnosis Date  . Depression   . Hyperlipidemia   . Hypertension    Past Surgical History  Procedure Laterality Date  . Appendectomy    . Tonsillectomy      reports that she has never smoked. She does not have any smokeless tobacco history on file. She reports that she drinks alcohol. Her drug history is not on file. family history includes Arthritis in an other family member; Cancer in an other family member; Coronary artery disease in her maternal grandfather; Diabetes in her father; Heart disease in an other family member; Hyperlipidemia in an other family member; Hypertension in an other family member. Allergies  Allergen Reactions  . Codeine Sulfate     REACTION: anxiety      Review of Systems  Constitutional: Negative for fever, activity change, appetite change, fatigue and unexpected weight change.  HENT: Negative for ear pain, hearing loss, sore throat and trouble swallowing.   Eyes: Negative for visual disturbance.  Respiratory: Negative for cough and shortness of breath.   Cardiovascular: Negative for chest pain and palpitations.  Gastrointestinal: Negative for abdominal pain, diarrhea, constipation and blood in stool.  Genitourinary: Negative for dysuria and hematuria.  Musculoskeletal:  Negative for myalgias, back pain and arthralgias.  Skin: Negative for rash.  Neurological: Negative for dizziness, syncope and headaches.  Hematological: Negative for adenopathy.  Psychiatric/Behavioral: Negative for confusion and dysphoric mood.       Objective:   Physical Exam  Constitutional: She is oriented to person, place, and time. She appears well-developed and well-nourished.  HENT:  Head: Normocephalic and atraumatic.  Eyes: EOM are normal. Pupils are equal, round, and reactive to light.  Neck: Normal range of motion. Neck supple. No thyromegaly present.  Cardiovascular: Normal rate, regular rhythm and normal heart sounds.   No murmur heard. Pulmonary/Chest: Breath sounds normal. No respiratory distress. She has no wheezes. She has no rales.  Abdominal: Soft. Bowel sounds are normal. She exhibits no distension and no mass. There is no tenderness. There is no rebound and no guarding.  Musculoskeletal: Normal range of motion. She exhibits no edema.  Lymphadenopathy:    She has no cervical adenopathy.  Neurological: She is alert and oriented to person, place, and time. She displays normal reflexes. No cranial nerve deficit.  Skin: No rash noted.  Psychiatric: She has a normal mood and affect. Her behavior is normal. Judgment and thought content normal.          Assessment & Plan:  Complete physical. Labs reviewed. She has technically prediabetes with glucose 102. We've discussed continued weight loss efforts and establish consistent exercise. She has done an excellent job recently with weight loss of about 20 pounds already.  Recheck fasting blood sugar within one year and consider A1c. Flu shot  offered and declined.

## 2015-04-08 ENCOUNTER — Telehealth: Payer: Self-pay | Admitting: Family Medicine

## 2015-04-08 NOTE — Telephone Encounter (Signed)
Spoke with patient, pt schedule appointment to be seen on 04/09/15 10:00am

## 2015-04-08 NOTE — Telephone Encounter (Signed)
Pt called in to inquire about coming in to see doc about back pains. Asked to speak directly with nurse. Informed pt that I will make a note and she will be contacted.

## 2015-04-09 ENCOUNTER — Encounter: Payer: Self-pay | Admitting: Family Medicine

## 2015-04-09 ENCOUNTER — Ambulatory Visit (INDEPENDENT_AMBULATORY_CARE_PROVIDER_SITE_OTHER): Payer: Managed Care, Other (non HMO) | Admitting: Family Medicine

## 2015-04-09 VITALS — BP 130/80 | HR 78 | Temp 98.4°F | Wt 243.0 lb

## 2015-04-09 DIAGNOSIS — M545 Low back pain, unspecified: Secondary | ICD-10-CM

## 2015-04-09 MED ORDER — CYCLOBENZAPRINE HCL 10 MG PO TABS: 10.0000 mg | ORAL_TABLET | Freq: Three times a day (TID) | ORAL | Status: DC | PRN

## 2015-04-09 MED ORDER — HYDROCODONE-ACETAMINOPHEN 5-325 MG PO TABS: 1.0000 | ORAL_TABLET | Freq: Four times a day (QID) | ORAL | Status: DC | PRN

## 2015-04-09 NOTE — Patient Instructions (Signed)
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
## 2015-04-09 NOTE — Progress Notes (Signed)
Pre visit review using our clinic review tool, if applicable. No additional management support is needed unless otherwise documented below in the visit note. 

## 2015-09-13 ENCOUNTER — Other Ambulatory Visit: Payer: Self-pay | Admitting: Family Medicine

## 2016-09-06 ENCOUNTER — Other Ambulatory Visit: Payer: Self-pay | Admitting: Family Medicine

## 2016-12-08 ENCOUNTER — Ambulatory Visit (INDEPENDENT_AMBULATORY_CARE_PROVIDER_SITE_OTHER): Payer: Managed Care, Other (non HMO) | Admitting: Family Medicine

## 2016-12-08 ENCOUNTER — Encounter: Payer: Self-pay | Admitting: Family Medicine

## 2016-12-08 VITALS — BP 124/90 | HR 90 | Temp 98.5°F | Ht 66.0 in | Wt 257.0 lb

## 2016-12-08 DIAGNOSIS — J019 Acute sinusitis, unspecified: Secondary | ICD-10-CM

## 2016-12-08 MED ORDER — AMOXICILLIN-POT CLAVULANATE 875-125 MG PO TABS: 1.0000 | ORAL_TABLET | Freq: Two times a day (BID) | ORAL | 0 refills | Status: DC

## 2016-12-08 NOTE — Patient Instructions (Signed)
Follow up for any shortness of breath or recurrent fever.

## 2016-12-08 NOTE — Progress Notes (Signed)
Pre visit review using our clinic review tool, if applicable. No additional management support is needed unless otherwise documented below in the visit note. 

## 2016-12-08 NOTE — Progress Notes (Signed)
Subjective:     Patient ID: Gina Vargas, female   DOB: 27-Aug-1971, 46 y.o.   MRN: [id number]  HPI Patient is nonsmoker seen with acute illness. Last week she had some vomiting and diarrhea along with chills and severe body aches. She thought she had flulike illness but really did not have much cough. By Thursday she was some better and over the weekend had more energy and was afebrile. Monday this week she had increased fatigue and increased coughing. Has productive coughing. Progressive maxillary facial pain. No fevers or chills. She's not had any recurrent vomiting or diarrhea. No dyspnea.  Past Medical History:  Diagnosis Date  . Depression   . Hyperlipidemia   . Hypertension    Past Surgical History:  Procedure Laterality Date  . APPENDECTOMY    . TONSILLECTOMY      reports that she has never smoked. She has never used smokeless tobacco. She reports that she drinks alcohol. Her drug history is not on file. family history includes Coronary artery disease in her maternal grandfather; Diabetes in her father. Allergies  Allergen Reactions  . Codeine Sulfate     REACTION: anxiety     Review of Systems  Constitutional: Positive for chills and fatigue.  HENT: Positive for sinus pain and sinus pressure.   Respiratory: Positive for cough.   Neurological: Positive for headaches.       Objective:   Physical Exam  Constitutional: She appears well-developed and well-nourished.  HENT:  Right Ear: External ear normal.  Left Ear: External ear normal.  Mouth/Throat: Oropharynx is clear and moist. No oropharyngeal exudate.  Neck: Neck supple.  Cardiovascular: Normal rate and regular rhythm.   Pulmonary/Chest: Effort normal and breath sounds normal. No respiratory distress. She has no wheezes. She has no rales.  Lymphadenopathy:    She has no cervical adenopathy.       Assessment:     Cough and question of acute sinusitis following what sounds like recent viral illness over week  ago    Plan:     -She's had second wave of illness with worsening after feeling better last weekend. We elected to cover for sinusitis with Augmentin 875 mg twice daily for 10 days. Consider probiotic. -Stay well-hydrated. Follow-up immediately for any increased fever or shortness of breath  Kristian CoveyBruce W Markeshia Giebel MD [facility]

## 2017-04-21 ENCOUNTER — Other Ambulatory Visit: Payer: Self-pay

## 2017-04-21 ENCOUNTER — Telehealth: Payer: Self-pay | Admitting: Family Medicine

## 2017-04-21 ENCOUNTER — Telehealth: Payer: Self-pay

## 2017-04-21 MED ORDER — CYCLOBENZAPRINE HCL 10 MG PO TABS: 10.0000 mg | ORAL_TABLET | Freq: Every day | ORAL | 0 refills | Status: DC

## 2017-04-21 NOTE — Telephone Encounter (Signed)
I left message for patient to return phone call.   

## 2017-04-21 NOTE — Telephone Encounter (Signed)
Pt returning your call

## 2017-04-21 NOTE — Telephone Encounter (Signed)
Patient has diagnosed with left sided low back pain without sciatica by Dr. Caryl NeverBurchette on 04/09/15 - patient was prescribed Flexeril 10mg  at that visit, and patient states it helped very well.  Patient states she was cleaning yesterday and she felt a sharp pain in her lower left back, she states this is the same area as before, and states that she is having a very difficult time getting things done since her pain is very severe. Patient states she can barely even shower and get dressed, and so she refused an appointment in the office, but would like to have a refill on Flexeril. I advised patient that she would need to be evaluated in the office before medication could be prescribed, or she could try UC - patient refused. I advised patient that Dr. Caryl NeverBurchette is out of of the office today, and patient requested that another provider review this and see if Flexeril can be refilled?  Dr. Durene CalHunter, would you mind advising in Dr. Lucie LeatherBurchette's absence?

## 2017-04-21 NOTE — Telephone Encounter (Signed)
See previous note

## 2017-04-21 NOTE — Telephone Encounter (Signed)
Im ok with a 1x refill #30 and advise follow up with Dr. Caryl NeverBurchette if has additional needs

## 2017-04-21 NOTE — Telephone Encounter (Signed)
I contacted patient and notified her of Dr. Erasmo LeventhalHunter's comments. Patient verbalized understanding. Rx has been sent in as directed.

## 2017-07-14 ENCOUNTER — Encounter: Payer: Self-pay | Admitting: Family Medicine

## 2017-09-01 ENCOUNTER — Other Ambulatory Visit: Payer: Self-pay | Admitting: Family Medicine

## 2017-10-11 LAB — HM MAMMOGRAPHY

## 2017-10-28 ENCOUNTER — Ambulatory Visit (INDEPENDENT_AMBULATORY_CARE_PROVIDER_SITE_OTHER): Payer: Managed Care, Other (non HMO) | Admitting: Family Medicine

## 2017-10-28 ENCOUNTER — Encounter: Payer: Self-pay | Admitting: Family Medicine

## 2017-10-28 VITALS — BP 132/94 | HR 74 | Temp 97.9°F | Ht 66.0 in | Wt 261.0 lb

## 2017-10-28 DIAGNOSIS — Z23 Encounter for immunization: Secondary | ICD-10-CM | POA: Diagnosis not present

## 2017-10-28 DIAGNOSIS — Z0000 Encounter for general adult medical examination without abnormal findings: Secondary | ICD-10-CM

## 2017-10-28 MED ORDER — ALPRAZOLAM 0.5 MG PO TABS: ORAL_TABLET | ORAL | 0 refills | Status: DC

## 2017-10-28 MED ORDER — LOSARTAN POTASSIUM 50 MG PO TABS: 50.0000 mg | ORAL_TABLET | Freq: Every day | ORAL | 11 refills | Status: DC

## 2017-10-28 NOTE — Progress Notes (Signed)
Subjective:     Patient ID: Gina Vargas, female   DOB: 1971/06/27, 47 y.o.   MRN: [id number]  HPI Patient seen for physical exam. She is followed regularly by gynecology. She has discussed weight loss strategies recently with her gynecologist. Requesting fasting labs today.  She's had some borderline to slightly elevated blood pressure readings recently with several systolic readings from 130 to 409140 systolic and diastolics from 90. No headaches. No chest pains. No peripheral edema. Plans to start more consistent exercise soon. No regular alcohol use. No nonsteroidal use.  Family history significant for dad and brother with type 2 diabetes. Mother had TIA and CABG at age 48.  Travels frequently with work. Upcoming travel to [place]. She is concerned about difficulties with sleeping on the plane and when she gets there. She's tried Benadryl which did not work well - makes her feel "hyped up ".  Past Medical History:  Diagnosis Date  . Depression   . Hyperlipidemia   . Hypertension    Past Surgical History:  Procedure Laterality Date  . APPENDECTOMY    . TONSILLECTOMY      reports that  has never smoked. she has never used smokeless tobacco. She reports that she drinks alcohol. Her drug history is not on file. family history includes Arthritis in her unknown relative; Cancer in her unknown relative; Coronary artery disease in her maternal grandfather; Diabetes in her father; Heart disease in her unknown relative; Hyperlipidemia in her unknown relative; Hypertension in her unknown relative. Allergies  Allergen Reactions  . Codeine Sulfate     REACTION: anxiety     Review of Systems  Constitutional: Negative for activity change, appetite change, fatigue, fever and unexpected weight change.  HENT: Negative for ear pain, hearing loss, sore throat and trouble swallowing.   Eyes: Negative for visual disturbance.  Respiratory: Negative for cough and shortness of breath.   Cardiovascular:  Negative for chest pain and palpitations.  Gastrointestinal: Negative for abdominal pain, blood in stool, constipation and diarrhea.  Genitourinary: Negative for dysuria and hematuria.  Musculoskeletal: Negative for arthralgias, back pain and myalgias.  Skin: Negative for rash.  Neurological: Negative for dizziness, syncope and headaches.  Hematological: Negative for adenopathy.  Psychiatric/Behavioral: Negative for confusion and dysphoric mood.       Objective:   Physical Exam  Constitutional: She is oriented to person, place, and time. She appears well-developed and well-nourished.  HENT:  Head: Normocephalic and atraumatic.  Eyes: EOM are normal. Pupils are equal, round, and reactive to light.  Neck: Normal range of motion. Neck supple. No thyromegaly present.  Cardiovascular: Normal rate, regular rhythm and normal heart sounds.  No murmur heard. Pulmonary/Chest: Breath sounds normal. No respiratory distress. She has no wheezes. She has no rales.  Abdominal: Soft. Bowel sounds are normal. She exhibits no distension and no mass. There is no tenderness. There is no rebound and no guarding.  Musculoskeletal: Normal range of motion. She exhibits no edema.  Lymphadenopathy:    She has no cervical adenopathy.  Neurological: She is alert and oriented to person, place, and time. She displays normal reflexes. No cranial nerve deficit.  Skin: No rash noted.  Psychiatric: She has a normal mood and affect. Her behavior is normal. Judgment and thought content normal.       Assessment:     Physical exam. Patient has borderline elevated blood pressure and has for quite some time. Upcoming travel with concerns for insomnia and also anxiety with flying  Plan:     -We agreed to limited alprazolam 0.5 mg one hour prior to flight #12 with no refill -Strongly encouraged to lose weight -Obtain screening lab work -Start losartan 50 mg once daily and office follow-up in one month to  reassess -Establish more consistent exercise -Flu vaccine given  Kristian Covey MD [facility] at [facility]

## 2017-10-29 LAB — BASIC METABOLIC PANEL
BUN: 16 mg/dL (ref 7–25)
CHLORIDE: 101 mmol/L (ref 98–110)
CO2: 26 mmol/L (ref 20–32)
CREATININE: 0.77 mg/dL (ref 0.50–1.10)
Calcium: 9.9 mg/dL (ref 8.6–10.2)
Glucose, Bld: 86 mg/dL (ref 65–99)
POTASSIUM: 4.2 mmol/L (ref 3.5–5.3)
Sodium: 137 mmol/L (ref 135–146)

## 2017-10-29 LAB — CBC WITH DIFFERENTIAL/PLATELET
BASOS PCT: 0.4 %
Basophils Absolute: 44 cells/uL (ref 0–200)
EOS ABS: 189 {cells}/uL (ref 15–500)
Eosinophils Relative: 1.7 %
HEMATOCRIT: 43.9 % (ref 35.0–45.0)
Hemoglobin: 14.6 g/dL (ref 11.7–15.5)
Lymphs Abs: 3685 cells/uL (ref 850–3900)
MCH: 28.7 pg (ref 27.0–33.0)
MCHC: 33.3 g/dL (ref 32.0–36.0)
MCV: 86.4 fL (ref 80.0–100.0)
MPV: 10.3 fL (ref 7.5–12.5)
Monocytes Relative: 5.6 %
NEUTROS PCT: 59.1 %
Neutro Abs: 6560 cells/uL (ref 1500–7800)
Platelets: 386 10*3/uL (ref 140–400)
RBC: 5.08 10*6/uL (ref 3.80–5.10)
RDW: 12.8 % (ref 11.0–15.0)
Total Lymphocyte: 33.2 %
WBC: 11.1 10*3/uL — AB (ref 3.8–10.8)
WBCMIX: 622 {cells}/uL (ref 200–950)

## 2017-10-29 LAB — HEPATIC FUNCTION PANEL
AG Ratio: 1.8 (calc) (ref 1.0–2.5)
ALBUMIN MSPROF: 4.6 g/dL (ref 3.6–5.1)
ALT: 21 U/L (ref 6–29)
AST: 19 U/L (ref 10–35)
Alkaline phosphatase (APISO): 85 U/L (ref 33–115)
Bilirubin, Direct: 0.1 mg/dL (ref 0.0–0.2)
GLOBULIN: 2.5 g/dL (ref 1.9–3.7)
Indirect Bilirubin: 0.5 mg/dL (calc) (ref 0.2–1.2)
TOTAL PROTEIN: 7.1 g/dL (ref 6.1–8.1)
Total Bilirubin: 0.6 mg/dL (ref 0.2–1.2)

## 2017-10-29 LAB — LIPID PANEL
CHOL/HDL RATIO: 4.1 (calc) (ref <5.0)
CHOLESTEROL: 228 mg/dL — AB (ref <200)
HDL: 56 mg/dL (ref >50)
LDL CHOLESTEROL (CALC): 150 mg/dL — AB
Non-HDL Cholesterol (Calc): 172 mg/dL (calc) — ABNORMAL HIGH (ref <130)
TRIGLYCERIDES: 108 mg/dL (ref <150)

## 2017-10-29 LAB — HEMOGLOBIN A1C
EAG (MMOL/L): 6.6 (calc)
Hgb A1c MFr Bld: 5.8 % of total Hgb — ABNORMAL HIGH (ref <5.7)
Mean Plasma Glucose: 120 (calc)

## 2017-10-29 LAB — TSH: TSH: 2.98 m[IU]/L

## 2017-10-30 ENCOUNTER — Encounter: Payer: Self-pay | Admitting: Family Medicine

## 2017-11-07 ENCOUNTER — Encounter: Payer: Self-pay | Admitting: Family Medicine

## 2017-12-02 ENCOUNTER — Ambulatory Visit (INDEPENDENT_AMBULATORY_CARE_PROVIDER_SITE_OTHER): Payer: Managed Care, Other (non HMO) | Admitting: Family Medicine

## 2017-12-02 ENCOUNTER — Encounter: Payer: Self-pay | Admitting: Family Medicine

## 2017-12-02 DIAGNOSIS — I10 Essential (primary) hypertension: Secondary | ICD-10-CM

## 2017-12-02 NOTE — Progress Notes (Signed)
Subjective:     Patient ID: Gina Vargas, female   DOB: Sep 18, 1971, 47 y.o.   MRN: [id number]  HPI Here for follow up hypertension.  Was seen for CPE recently.  Several week hx of readings > 140 systolic and > 90 diastolic.  We started Losartan 50 mg once daily.  Felt better after about 4 days.  She is very pleased with the medication.  She has started a weight loss program with another practice and is taking Qsymia at low dose. Also starting Weight Watchers. She had come off blood pressure medication eventually also plans to start diligent exercise program soon  Past Medical History:  Diagnosis Date  . Depression   . Hyperlipidemia   . Hypertension    Past Surgical History:  Procedure Laterality Date  . APPENDECTOMY    . TONSILLECTOMY      reports that  has never smoked. she has never used smokeless tobacco. She reports that she drinks alcohol. Her drug history is not on file. family history includes Arthritis in her unknown relative; Cancer in her unknown relative; Coronary artery disease in her maternal grandfather; Diabetes in her father; Heart disease in her unknown relative; Hyperlipidemia in her unknown relative; Hypertension in her unknown relative. Allergies  Allergen Reactions  . Codeine Sulfate     REACTION: anxiety     Review of Systems  Constitutional: Negative for fatigue.  Eyes: Negative for visual disturbance.  Respiratory: Negative for cough, chest tightness, shortness of breath and wheezing.   Cardiovascular: Negative for chest pain, palpitations and leg swelling.  Neurological: Negative for dizziness, seizures, syncope, weakness, light-headedness and headaches.       Objective:   Physical Exam  Constitutional: She appears well-developed and well-nourished.  Eyes: Pupils are equal, round, and reactive to light.  Neck: Neck supple. No JVD present. No thyromegaly present.  Cardiovascular: Normal rate and regular rhythm. Exam reveals no gallop.   Pulmonary/Chest: Effort normal and breath sounds normal. No respiratory distress. She has no wheezes. She has no rales.  Musculoskeletal: She exhibits no edema.  Neurological: She is alert.       Assessment:     Hypertension-greatly improved and patient feeling much better overall    Plan:     -Continue losartan 50 mg once daily. We discussed the fact that we may be on taper her off this in the next several months if she continues with her weight loss efforts and exercise.  Kristian CoveyBruce W Burchette MD [facility]

## 2018-03-27 ENCOUNTER — Other Ambulatory Visit: Payer: Managed Care, Other (non HMO)

## 2018-03-27 ENCOUNTER — Other Ambulatory Visit: Payer: Self-pay | Admitting: Family Medicine

## 2018-04-03 ENCOUNTER — Ambulatory Visit: Payer: Managed Care, Other (non HMO) | Admitting: Family Medicine

## 2018-08-06 ENCOUNTER — Other Ambulatory Visit: Payer: Self-pay | Admitting: Family Medicine

## 2018-10-03 ENCOUNTER — Other Ambulatory Visit: Payer: Self-pay | Admitting: Family Medicine

## 2018-10-19 LAB — HM MAMMOGRAPHY

## 2018-11-06 ENCOUNTER — Other Ambulatory Visit: Payer: Self-pay | Admitting: Family Medicine

## 2018-12-04 ENCOUNTER — Other Ambulatory Visit: Payer: Self-pay | Admitting: Family Medicine

## 2018-12-11 ENCOUNTER — Other Ambulatory Visit: Payer: Self-pay

## 2018-12-11 ENCOUNTER — Encounter: Payer: Self-pay | Admitting: Family Medicine

## 2018-12-11 ENCOUNTER — Ambulatory Visit (INDEPENDENT_AMBULATORY_CARE_PROVIDER_SITE_OTHER): Payer: Managed Care, Other (non HMO) | Admitting: Family Medicine

## 2018-12-11 VITALS — BP 128/84 | HR 70 | Temp 98.3°F | Ht 65.5 in | Wt 237.4 lb

## 2018-12-11 DIAGNOSIS — E669 Obesity, unspecified: Secondary | ICD-10-CM

## 2018-12-11 DIAGNOSIS — E785 Hyperlipidemia, unspecified: Secondary | ICD-10-CM

## 2018-12-11 DIAGNOSIS — Z23 Encounter for immunization: Secondary | ICD-10-CM

## 2018-12-11 DIAGNOSIS — I10 Essential (primary) hypertension: Secondary | ICD-10-CM | POA: Diagnosis not present

## 2018-12-11 DIAGNOSIS — F32A Depression, unspecified: Secondary | ICD-10-CM

## 2018-12-11 DIAGNOSIS — F329 Major depressive disorder, single episode, unspecified: Secondary | ICD-10-CM | POA: Diagnosis not present

## 2018-12-11 MED ORDER — FLUOXETINE HCL 40 MG PO CAPS: ORAL_CAPSULE | ORAL | 3 refills | Status: DC

## 2018-12-11 MED ORDER — LOSARTAN POTASSIUM 50 MG PO TABS: 50.0000 mg | ORAL_TABLET | Freq: Every day | ORAL | 3 refills | Status: DC

## 2018-12-11 NOTE — Addendum Note (Signed)
Addended by: Faustino Congress L on: 12/11/2018 10:14 AM   Modules accepted: Orders

## 2018-12-11 NOTE — Progress Notes (Signed)
Subjective:     Patient ID: Gina Vargas, female   DOB: 1970/12/29, 48 y.o.   MRN: [id number]  HPI  Patient in for medical follow-up.  She needs some refills.  She has history of obesity.  Her OB placed her on Q Samia last year and she lost around 40 pounds but then plateaued.  She is now on phentermine but has had some insomnia and has not taken this consistently more than 2 nights continuous.  Currently participating with weight watchers  Her weight is still down about 20 pounds from last year.  She has hypertension and was started last on losartan 50 mg daily.  Her blood pressures been stable.  No headaches or dizziness.  She has history of hyperlipidemia.  She would like to lose about 10 or 15 more pounds and then recheck her fasting lipids.  She has no chronic recurrent depression and remains on Prozac 40 mg daily.  She has been on this for several years and reluctant to stop.  Depression symptoms are currently stable  Past Medical History:  Diagnosis Date  . Depression   . Hyperlipidemia   . Hypertension    Past Surgical History:  Procedure Laterality Date  . APPENDECTOMY    . TONSILLECTOMY      reports that she has never smoked. She has never used smokeless tobacco. She reports current alcohol use. She reports that she does not use drugs. family history includes Arthritis in an other family member; Cancer in an other family member; Coronary artery disease in her maternal grandfather; Diabetes in her father; Heart disease in an other family member; Hyperlipidemia in an other family member; Hypertension in an other family member. Allergies  Allergen Reactions  . Codeine Sulfate     REACTION: anxiety    Review of Systems  Constitutional: Negative for fatigue and unexpected weight change.  Eyes: Negative for visual disturbance.  Respiratory: Negative for cough, chest tightness, shortness of breath and wheezing.   Cardiovascular: Negative for chest pain, palpitations and leg  swelling.  Endocrine: Negative for polydipsia and polyuria.  Neurological: Negative for dizziness, seizures, syncope, weakness, light-headedness and headaches.       Objective:   Physical Exam Constitutional:      Appearance: She is well-developed.  Eyes:     Pupils: Pupils are equal, round, and reactive to light.  Neck:     Musculoskeletal: Neck supple.     Thyroid: No thyromegaly.     Vascular: No JVD.  Cardiovascular:     Rate and Rhythm: Normal rate and regular rhythm.     Heart sounds: No gallop.   Pulmonary:     Effort: Pulmonary effort is normal. No respiratory distress.     Breath sounds: Normal breath sounds. No wheezing or rales.  Musculoskeletal:     Right lower leg: No edema.     Left lower leg: No edema.  Neurological:     Mental Status: She is alert.        Assessment:     #1 hypertension stable and at goal.  Repeat blood pressure after rest 120/78  #2 history of recurrent depression currently stable on fluoxetine  #3 obesity.  She has had some weight loss with Qsymia and weight watchers.  Currently on phentermine but not taking consistently  #4 hyperlipidemia    Plan:     -Refilled fluoxetine and losartan for 1 year -Patient will consider getting fasting lipids in 3 or 4 months after some additional weight loss -  She is considering going back on Q-symia if she continues to have insomnia with phentermine. -Flu vaccine given -She needs tetanus but wants to return later for that  Kristian Covey MD [facility] at [facility]

## 2018-12-15 ENCOUNTER — Other Ambulatory Visit: Payer: Self-pay | Admitting: Family Medicine

## 2018-12-18 NOTE — Telephone Encounter (Signed)
Please see message in yellow. Please advise if okay and quantity and refill amount. Thanks!  Last OV 12/11/18

## 2018-12-18 NOTE — Telephone Encounter (Signed)
May refill #90 with one refill. 

## 2019-02-08 ENCOUNTER — Encounter: Payer: Self-pay | Admitting: Family Medicine

## 2019-02-08 ENCOUNTER — Ambulatory Visit: Payer: Self-pay | Admitting: Family Medicine

## 2019-02-08 ENCOUNTER — Other Ambulatory Visit: Payer: Self-pay

## 2019-02-08 ENCOUNTER — Ambulatory Visit (INDEPENDENT_AMBULATORY_CARE_PROVIDER_SITE_OTHER): Payer: Managed Care, Other (non HMO) | Admitting: Family Medicine

## 2019-02-08 VITALS — Temp 97.7°F | Wt 244.6 lb

## 2019-02-08 DIAGNOSIS — R1030 Lower abdominal pain, unspecified: Secondary | ICD-10-CM | POA: Diagnosis not present

## 2019-02-08 LAB — CBC WITH DIFFERENTIAL/PLATELET
Basophils Absolute: 0.1 10*3/uL (ref 0.0–0.1)
Basophils Relative: 0.4 % (ref 0.0–3.0)
Eosinophils Absolute: 0.2 10*3/uL (ref 0.0–0.7)
Eosinophils Relative: 1.6 % (ref 0.0–5.0)
HCT: 43.2 % (ref 36.0–46.0)
Hemoglobin: 14.3 g/dL (ref 12.0–15.0)
Lymphocytes Relative: 19.1 % (ref 12.0–46.0)
Lymphs Abs: 2.7 10*3/uL (ref 0.7–4.0)
MCHC: 33.2 g/dL (ref 30.0–36.0)
MCV: 90.1 fl (ref 78.0–100.0)
Monocytes Absolute: 0.9 10*3/uL (ref 0.1–1.0)
Monocytes Relative: 6.5 % (ref 3.0–12.0)
Neutro Abs: 10.3 10*3/uL — ABNORMAL HIGH (ref 1.4–7.7)
Neutrophils Relative %: 72.4 % (ref 43.0–77.0)
Platelets: 371 10*3/uL (ref 150.0–400.0)
RBC: 4.79 Mil/uL (ref 3.87–5.11)
RDW: 13.8 % (ref 11.5–15.5)
WBC: 14.3 10*3/uL — ABNORMAL HIGH (ref 4.0–10.5)

## 2019-02-08 LAB — POCT URINALYSIS DIPSTICK
Bilirubin, UA: NEGATIVE
Blood, UA: NEGATIVE
Glucose, UA: NEGATIVE
Ketones, UA: NEGATIVE
Leukocytes, UA: NEGATIVE
Nitrite, UA: NEGATIVE
Odor: NEGATIVE
Protein, UA: NEGATIVE
Spec Grav, UA: 1.015 (ref 1.010–1.025)
Urobilinogen, UA: 0.2 E.U./dL
pH, UA: 6.5 (ref 5.0–8.0)

## 2019-02-08 NOTE — Telephone Encounter (Signed)
LM for call back - needs virtual visit

## 2019-02-08 NOTE — Telephone Encounter (Signed)
Patient already on schedule this morning at 9:15am.

## 2019-02-08 NOTE — Telephone Encounter (Signed)
Pt. Reports she started having abdominal pain and bloating Tuesday night. Has gas and bloating. Pain 5/10. Does have issues with constipation. Last Answer Assessment - Initial Assessment Questions 1. LOCATION: "Where does it hurt?"      At the naval and below 2. RADIATION: "Does the pain shoot anywhere else?" (e.g., chest, back)     Unsure 3. ONSET: "When did the pain begin?" (e.g., minutes, hours or days ago)      Started Tuesday night 4. SUDDEN: "Gradual or sudden onset?"     Gradual 5. PATTERN "Does the pain come and go, or is it constant?"    - If constant: "Is it getting better, staying the same, or worsening?"      (Note: Constant means the pain never goes away completely; most serious pain is constant and it progresses)     - If intermittent: "How long does it last?" "Do you have pain now?"     (Note: Intermittent means the pain goes away completely between bouts)     Constant 6. SEVERITY: "How bad is the pain?"  (e.g., Scale 1-10; mild, moderate, or severe)   - MILD (1-3): doesn't interfere with normal activities, abdomen soft and not tender to touch    - MODERATE (4-7): interferes with normal activities or awakens from sleep, tender to touch    - SEVERE (8-10): excruciating pain, doubled over, unable to do any normal activities      5-6 7. RECURRENT SYMPTOM: "Have you ever had this type of abdominal pain before?" If so, ask: "When was the last time?" and "What happened that time?"      No 8. CAUSE: "What do you think is causing the abdominal pain?"     Took some detox powder - Monday and Tuesday. 9. RELIEVING/AGGRAVATING FACTORS: "What makes it better or worse?" (e.g., movement, antacids, bowel movement)     No 10. OTHER SYMPTOMS: "Has there been any vomiting, diarrhea, constipation, or urine problems?"       Does have problems with constipation 11. PREGNANCY: "Is there any chance you are pregnant?" "When was your last menstrual period?"       No  Protocols used: ABDOMINAL  PAIN - [facility]

## 2019-02-08 NOTE — Progress Notes (Signed)
Subjective:     Patient ID: Gina Vargas, female   DOB: 1971-09-23, 48 y.o.   MRN: [id number]  HPI Patient is seen for abdominal pain.  She initially was contacted for video visit this morning but then we decided to convert this to an office assessment.  She states that current symptoms started couple days ago.  She recalls eating some hummis on Monday and Tuesday.  She thought she initially had some gas-like pains related to that.  She recalls having bowel movement on Tuesday and Wednesday which were fairly normal.  She developed some cramp-like pains periumbilical and just inferior to this region somewhat bilateral on Tuesday.  She has had some intermittent bloating.  No dysuria  She states that she normally runs around 97 F and had temperature 99.1 yesterday.  This was back down to 98.6 today.  She has had some nonspecific symptoms of headache and fatigue.  No cough.  No dyspnea.  No urinary symptoms.  She had some transient nausea yesterday but no vomiting.  No nausea today.  No diarrhea.  Had appendectomy about 15 years ago.  No exacerbating or alleviating factors other than pain slightly exacerbated with walking.  She has had increased appetite this morning.  No history of known diverticular disease.  Denies any upper abdominal pain  Past Medical History:  Diagnosis Date  . Depression   . Hyperlipidemia   . Hypertension    Past Surgical History:  Procedure Laterality Date  . APPENDECTOMY    . TONSILLECTOMY      reports that she has never smoked. She has never used smokeless tobacco. She reports current alcohol use. She reports that she does not use drugs. family history includes Arthritis in an other family member; Cancer in an other family member; Coronary artery disease in her maternal grandfather; Diabetes in her father; Heart disease in an other family member; Hyperlipidemia in an other family member; Hypertension in an other family member. Allergies  Allergen Reactions  .  Codeine Sulfate     REACTION: anxiety     Review of Systems  Constitutional: Positive for fatigue and fever. Negative for chills.  Respiratory: Negative for cough, shortness of breath and wheezing.   Cardiovascular: Negative for chest pain.  Gastrointestinal: Positive for abdominal pain. Negative for blood in stool, constipation, diarrhea and vomiting.  Genitourinary: Negative for dysuria.  Musculoskeletal: Negative for back pain.  Hematological: Negative for adenopathy.       Objective:   Physical Exam Constitutional:      Appearance: Normal appearance.  Cardiovascular:     Rate and Rhythm: Normal rate and regular rhythm.  Pulmonary:     Effort: Pulmonary effort is normal.     Breath sounds: Normal breath sounds.  Abdominal:     General: Bowel sounds are normal. There is no distension.     Palpations: Abdomen is soft.     Comments: She has some mild to moderate tenderness bilaterally left and right lower quadrants.  No guarding.  No hepatomegaly or splenomegaly  Skin:    Findings: No rash.  Neurological:     Mental Status: She is alert.        Assessment:     Patient presents with 2-day history of somewhat diffuse abdominal pain somewhat bilateral.  Prior appendectomy.  Nonacute abdomen.  She did report somewhat elevated temperature yesterday though no true fever.  She is afebrile now    Plan:     -Check urinalysis and stat CBC -Recommend bland  diet for now. -Follow-up promptly for any progressive abdominal pain, fever, vomiting, or other concerns -Consider CT abdomen and pelvis for any progressive pain,recurrent fever, or if significantly elevated white blood count  Kristian CoveyBruce W Angelica Frandsen MD [facility]

## 2019-02-08 NOTE — Telephone Encounter (Signed)
Last bowel movement was yesterday. Reports she did take a powdered detox Monday and Tuesday. Warm transfer into the practice with [facility] for an appointment.

## 2019-02-08 NOTE — Patient Instructions (Signed)
Abdominal Pain, Adult  Abdominal pain can be caused by many things. Often, abdominal pain is not serious and it gets better with no treatment or by being treated at home. However, sometimes abdominal pain is serious. Your health care provider will do a medical history and a physical exam to try to determine the cause of your abdominal pain.  Follow these instructions at home:   Take over-the-counter and prescription medicines only as told by your health care provider. Do not take a laxative unless told by your health care provider.   Drink enough fluid to keep your urine clear or pale yellow.   Watch your condition for any changes.   Keep all follow-up visits as told by your health care provider. This is important.  Contact a health care provider if:   Your abdominal pain changes or gets worse.   You are not hungry or you lose weight without trying.   You are constipated or have diarrhea for more than 2-3 days.   You have pain when you urinate or have a bowel movement.   Your abdominal pain wakes you up at night.   Your pain gets worse with meals, after eating, or with certain foods.   You are throwing up and cannot keep anything down.   You have a fever.  Get help right away if:   Your pain does not go away as soon as your health care provider told you to expect.   You cannot stop throwing up.   Your pain is only in areas of the abdomen, such as the right side or the left lower portion of the abdomen.   You have bloody or black stools, or stools that look like tar.   You have severe pain, cramping, or bloating in your abdomen.   You have signs of dehydration, such as:  ? Dark urine, very little urine, or no urine.  ? Cracked lips.  ? Dry mouth.  ? Sunken eyes.  ? Sleepiness.  ? Weakness.  This information is not intended to replace advice given to you by your health care provider. Make sure you discuss any questions you have with your health care provider.  Document Released: 07/21/2005 Document  Revised: 04/30/2016 Document Reviewed: 03/24/2016  [employer]vier Interactive Patient Education  2019 [employer]

## 2019-06-09 ENCOUNTER — Emergency Department (HOSPITAL_COMMUNITY): Payer: Managed Care, Other (non HMO)

## 2019-06-09 ENCOUNTER — Other Ambulatory Visit: Payer: Self-pay

## 2019-06-09 ENCOUNTER — Emergency Department (HOSPITAL_COMMUNITY)
Admission: EM | Admit: 2019-06-09 | Discharge: 2019-06-09 | Disposition: A | Discharge status: Home / Self Care | Payer: Managed Care, Other (non HMO) | Attending: Emergency Medicine | Admitting: Emergency Medicine

## 2019-06-09 ENCOUNTER — Encounter (HOSPITAL_COMMUNITY): Payer: Self-pay

## 2019-06-09 DIAGNOSIS — R51 Headache: Secondary | ICD-10-CM | POA: Diagnosis not present

## 2019-06-09 DIAGNOSIS — S20312A Abrasion of left front wall of thorax, initial encounter: Secondary | ICD-10-CM | POA: Diagnosis not present

## 2019-06-09 DIAGNOSIS — Y93I9 Activity, other involving external motion: Secondary | ICD-10-CM | POA: Diagnosis not present

## 2019-06-09 DIAGNOSIS — Y999 Unspecified external cause status: Secondary | ICD-10-CM | POA: Diagnosis not present

## 2019-06-09 DIAGNOSIS — Y92410 Unspecified street and highway as the place of occurrence of the external cause: Secondary | ICD-10-CM | POA: Diagnosis not present

## 2019-06-09 DIAGNOSIS — R0789 Other chest pain: Secondary | ICD-10-CM | POA: Insufficient documentation

## 2019-06-09 DIAGNOSIS — R109 Unspecified abdominal pain: Secondary | ICD-10-CM | POA: Diagnosis not present

## 2019-06-09 DIAGNOSIS — S30811A Abrasion of abdominal wall, initial encounter: Secondary | ICD-10-CM | POA: Insufficient documentation

## 2019-06-09 DIAGNOSIS — S40812A Abrasion of left upper arm, initial encounter: Secondary | ICD-10-CM | POA: Diagnosis not present

## 2019-06-09 DIAGNOSIS — Z23 Encounter for immunization: Secondary | ICD-10-CM | POA: Diagnosis not present

## 2019-06-09 DIAGNOSIS — T07XXXA Unspecified multiple injuries, initial encounter: Secondary | ICD-10-CM

## 2019-06-09 DIAGNOSIS — Z79899 Other long term (current) drug therapy: Secondary | ICD-10-CM | POA: Insufficient documentation

## 2019-06-09 DIAGNOSIS — I10 Essential (primary) hypertension: Secondary | ICD-10-CM | POA: Insufficient documentation

## 2019-06-09 DIAGNOSIS — M79645 Pain in left finger(s): Secondary | ICD-10-CM

## 2019-06-09 LAB — BASIC METABOLIC PANEL
Anion gap: 9 (ref 5–15)
BUN: 21 mg/dL — ABNORMAL HIGH (ref 6–20)
CO2: 25 mmol/L (ref 22–32)
Calcium: 10 mg/dL (ref 8.9–10.3)
Chloride: 104 mmol/L (ref 98–111)
Creatinine, Ser: 0.82 mg/dL (ref 0.44–1.00)
GFR calc Af Amer: >60 mL/min (ref >60)
GFR calc non Af Amer: >60 mL/min (ref >60)
Glucose, Bld: 109 mg/dL — ABNORMAL HIGH (ref 70–99)
Potassium: 4.1 mmol/L (ref 3.5–5.1)
Sodium: 138 mmol/L (ref 135–145)

## 2019-06-09 LAB — CBC WITH DIFFERENTIAL/PLATELET
Abs Immature Granulocytes: 0.2 10*3/uL — ABNORMAL HIGH (ref 0.00–0.07)
Basophils Absolute: 0.1 10*3/uL (ref 0.0–0.1)
Basophils Relative: 0 %
Eosinophils Absolute: 0.1 10*3/uL (ref 0.0–0.5)
Eosinophils Relative: 0 %
HCT: 48.8 % — ABNORMAL HIGH (ref 36.0–46.0)
Hemoglobin: 15.5 g/dL — ABNORMAL HIGH (ref 12.0–15.0)
Immature Granulocytes: 1 %
Lymphocytes Relative: 12 %
Lymphs Abs: 2.4 10*3/uL (ref 0.7–4.0)
MCH: 29.8 pg (ref 26.0–34.0)
MCHC: 31.8 g/dL (ref 30.0–36.0)
MCV: 93.7 fL (ref 80.0–100.0)
Monocytes Absolute: 0.9 10*3/uL (ref 0.1–1.0)
Monocytes Relative: 5 %
Neutro Abs: 15.6 10*3/uL — ABNORMAL HIGH (ref 1.7–7.7)
Neutrophils Relative %: 82 %
Platelets: 394 10*3/uL (ref 150–400)
RBC: 5.21 MIL/uL — ABNORMAL HIGH (ref 3.87–5.11)
RDW: 13.4 % (ref 11.5–15.5)
WBC: 19.2 10*3/uL — ABNORMAL HIGH (ref 4.0–10.5)
nRBC: 0 % (ref 0.0–0.2)

## 2019-06-09 LAB — I-STAT BETA HCG BLOOD, ED (NOT ORDERABLE): I-stat hCG, quantitative: <5 m[IU]/mL (ref <5)

## 2019-06-09 MED ORDER — SODIUM CHLORIDE (PF) 0.9 % IJ SOLN
INTRAMUSCULAR | Status: AC
  Filled 2019-06-09: qty 50

## 2019-06-09 MED ORDER — METHOCARBAMOL 500 MG PO TABS: 500.0000 mg | ORAL_TABLET | Freq: Two times a day (BID) | ORAL | 0 refills | Status: DC

## 2019-06-09 MED ORDER — MORPHINE SULFATE (PF) 4 MG/ML IV SOLN: 4.0000 mg | Freq: Once | INTRAVENOUS | Status: DC

## 2019-06-09 MED ORDER — TETANUS-DIPHTH-ACELL PERTUSSIS 5-2.5-18.5 LF-MCG/0.5 IM SUSP
0.5000 mL | Freq: Once | INTRAMUSCULAR | Status: AC
  Administered 2019-06-09: 0.5 mL via INTRAMUSCULAR
  Filled 2019-06-09: qty 0.5

## 2019-06-09 MED ORDER — IOHEXOL 300 MG/ML  SOLN
100.0000 mL | Freq: Once | INTRAMUSCULAR | Status: AC | PRN
  Administered 2019-06-09: 14:00:00 100 mL via INTRAVENOUS

## 2019-06-09 NOTE — ED Notes (Signed)
Patient provided with ice pack
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
## 2019-06-09 NOTE — ED Notes (Signed)
Family at bedside. 

## 2019-06-09 NOTE — ED Notes (Signed)
Lab at bedside
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
## 2019-06-09 NOTE — ED Notes (Signed)
Wound cleaned and dressing applied.  
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
## 2019-06-09 NOTE — ED Notes (Signed)
Patient transported to CT 

## 2019-11-23 ENCOUNTER — Encounter: Payer: Self-pay | Admitting: Family Medicine

## 2019-11-27 ENCOUNTER — Encounter: Payer: Self-pay | Admitting: Gastroenterology

## 2019-12-03 ENCOUNTER — Encounter: Payer: Self-pay | Admitting: Family Medicine

## 2019-12-24 ENCOUNTER — Other Ambulatory Visit: Payer: Self-pay | Admitting: Family Medicine

## 2019-12-24 NOTE — Telephone Encounter (Signed)
Refill for one month    needs follow up soon.

## 2019-12-28 ENCOUNTER — Encounter: Payer: Managed Care, Other (non HMO) | Admitting: Gastroenterology

## 2020-01-17 ENCOUNTER — Other Ambulatory Visit: Payer: Self-pay | Admitting: Family Medicine

## 2020-01-17 NOTE — Telephone Encounter (Signed)
Refill for 3 months.  Needs follow up soon.

## 2020-01-24 ENCOUNTER — Other Ambulatory Visit: Payer: Self-pay

## 2020-01-24 ENCOUNTER — Ambulatory Visit (AMBULATORY_SURGERY_CENTER): Payer: Self-pay | Admitting: *Deleted

## 2020-01-24 VITALS — Temp 97.5°F | Ht 65.5 in | Wt 252.2 lb

## 2020-01-24 DIAGNOSIS — Z1211 Encounter for screening for malignant neoplasm of colon: Secondary | ICD-10-CM

## 2020-01-24 DIAGNOSIS — Z01818 Encounter for other preprocedural examination: Secondary | ICD-10-CM

## 2020-01-24 NOTE — Progress Notes (Signed)
Pt takes smooth move as needed  Pt states she vomited after wisdom teeth removal and appendectomy  No intubation problems or fam hx/hx of malignant hyperthermia  Pt is aware that care partner will wait in the car during procedure; if they feel like they will be too hot or cold to wait in the car; they may wait in the 4 th floor lobby. Patient is aware to bring only one care partner. We want them to wear a mask (we do not have any that we can provide them), practice social distancing, and we will check their temperatures when they get here.  I did remind the patient that their care partner needs to stay in the parking lot the entire time and have a cell phone available, we will call them when the pt is ready for discharge. Patient will wear mask into building.   No egg or soy allergy  No home oxygen use  Pt told to stop Qsymia on 01-29-20 emmi information given   covid test 02-05-20 at 8:15 am

## 2020-02-08 ENCOUNTER — Encounter: Payer: Managed Care, Other (non HMO) | Admitting: Gastroenterology

## 2020-03-12 ENCOUNTER — Other Ambulatory Visit: Payer: Self-pay | Admitting: Family Medicine

## 2020-03-31 ENCOUNTER — Encounter: Payer: Self-pay | Admitting: Family Medicine

## 2020-03-31 ENCOUNTER — Other Ambulatory Visit: Payer: Self-pay

## 2020-03-31 ENCOUNTER — Ambulatory Visit (INDEPENDENT_AMBULATORY_CARE_PROVIDER_SITE_OTHER): Payer: Managed Care, Other (non HMO) | Admitting: Family Medicine

## 2020-03-31 VITALS — BP 126/62 | HR 87 | Temp 97.9°F | Wt 248.1 lb

## 2020-03-31 DIAGNOSIS — I10 Essential (primary) hypertension: Secondary | ICD-10-CM | POA: Diagnosis not present

## 2020-03-31 DIAGNOSIS — F329 Major depressive disorder, single episode, unspecified: Secondary | ICD-10-CM | POA: Diagnosis not present

## 2020-03-31 DIAGNOSIS — R7303 Prediabetes: Secondary | ICD-10-CM | POA: Diagnosis not present

## 2020-03-31 DIAGNOSIS — F32A Depression, unspecified: Secondary | ICD-10-CM

## 2020-03-31 MED ORDER — FLUOXETINE HCL 40 MG PO CAPS: ORAL_CAPSULE | ORAL | 3 refills | Status: DC

## 2020-03-31 MED ORDER — LOSARTAN POTASSIUM 25 MG PO TABS: ORAL_TABLET | ORAL | 3 refills | Status: DC

## 2020-03-31 MED ORDER — BUPROPION HCL ER (XL) 150 MG PO TB24
150.0000 mg | ORAL_TABLET | Freq: Every day | ORAL | 5 refills | Status: DC
Start: 2020-03-31 — End: 2020-04-23

## 2020-03-31 NOTE — Patient Instructions (Signed)
Give me some feedback in about 30 days regarding the Wellbutrin XL.
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
## 2020-04-23 ENCOUNTER — Other Ambulatory Visit: Payer: Self-pay | Admitting: Family Medicine

## 2020-04-30 ENCOUNTER — Encounter: Payer: Self-pay | Admitting: Family Medicine
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
## 2021-01-27 ENCOUNTER — Other Ambulatory Visit: Payer: Self-pay | Admitting: Family Medicine

## 2021-03-24 ENCOUNTER — Other Ambulatory Visit: Payer: Self-pay | Admitting: Family Medicine

## 2021-04-24 ENCOUNTER — Other Ambulatory Visit: Payer: Self-pay | Admitting: Family Medicine

## 2021-05-19 ENCOUNTER — Other Ambulatory Visit: Payer: Self-pay | Admitting: Family Medicine

## 2021-06-17 ENCOUNTER — Other Ambulatory Visit: Payer: Self-pay | Admitting: Family Medicine

## 2021-06-20 ENCOUNTER — Other Ambulatory Visit: Payer: Self-pay | Admitting: Family Medicine

## 2021-06-21 ENCOUNTER — Other Ambulatory Visit: Payer: Self-pay | Admitting: Family Medicine

## 2021-06-26 ENCOUNTER — Ambulatory Visit: Payer: Managed Care, Other (non HMO) | Admitting: Family Medicine

## 2021-07-01 ENCOUNTER — Ambulatory Visit (INDEPENDENT_AMBULATORY_CARE_PROVIDER_SITE_OTHER): Payer: Managed Care, Other (non HMO) | Admitting: Family Medicine

## 2021-07-01 ENCOUNTER — Encounter: Payer: Self-pay | Admitting: Family Medicine

## 2021-07-01 ENCOUNTER — Other Ambulatory Visit: Payer: Self-pay

## 2021-07-01 VITALS — BP 124/70 | HR 80 | Temp 98.0°F | Ht 65.5 in | Wt 257.9 lb

## 2021-07-01 DIAGNOSIS — E785 Hyperlipidemia, unspecified: Secondary | ICD-10-CM | POA: Diagnosis not present

## 2021-07-01 DIAGNOSIS — F32A Depression, unspecified: Secondary | ICD-10-CM

## 2021-07-01 DIAGNOSIS — I10 Essential (primary) hypertension: Secondary | ICD-10-CM | POA: Diagnosis not present

## 2021-07-01 MED ORDER — BUPROPION HCL ER (XL) 150 MG PO TB24: 150.0000 mg | ORAL_TABLET | Freq: Every day | ORAL | 3 refills | Status: DC

## 2021-07-01 MED ORDER — FLUOXETINE HCL 40 MG PO CAPS: 40.0000 mg | ORAL_CAPSULE | Freq: Every day | ORAL | 3 refills | Status: DC

## 2021-07-01 MED ORDER — LOSARTAN POTASSIUM 50 MG PO TABS: 50.0000 mg | ORAL_TABLET | Freq: Every day | ORAL | 3 refills | Status: DC

## 2021-07-01 NOTE — Progress Notes (Signed)
Established Patient Office Visit  Subjective:  Patient ID: Gina Vargas, female    DOB: 1971-05-18  Age: 50 y.o. MRN: [id number]  CC:  Chief Complaint  Patient presents with   Medication Refill    HPI Gina Vargas presents for medical follow-up.  She needs several refills.  She has hypertension treated with losartan 50 mg daily.  Blood pressures been well controlled.  No recent headaches or dizziness.  Compliant with therapy.  No side effects.  She has history of chronic/recurrent depression.  She is on combination therapy with Wellbutrin and Prozac and this seems to be working well.  She needs refills of both medications.  Denies any side effects.  Does have history of mild dyslipidemia and also prediabetes.  She has positive family history of type 2 diabetes and a couple of first-degree relatives.  Her last A1c was 5.8% but this was a few years ago.  No polyuria or polydipsia.  She sees GYN regularly for mammograms and Pap smears.  She knows she needs to schedule colonoscopy  Past Medical History:  Diagnosis Date   Anxiety    Arthritis    Depression    Hyperlipidemia    Hypertension    Kidney stone     Past Surgical History:  Procedure Laterality Date   APPENDECTOMY     TONSILLECTOMY     WISDOM TOOTH EXTRACTION      Family History  Problem Relation Age of Onset   Arthritis Other        fhx   Hyperlipidemia Other        fhx   Hypertension Other        fhx   Cancer Other        fhx   Coronary artery disease Maternal Grandfather    Diabetes Father    Heart disease Other        fhx   Colon polyps Mother    Colon cancer Neg Hx    Esophageal cancer Neg Hx    Stomach cancer Neg Hx    Rectal cancer Neg Hx     Social History   Socioeconomic History   Marital status: Single    Spouse name: Not on file   Number of children: Not on file   Years of education: Not on file   Highest education level: Not on file  Occupational History   Not on file  Tobacco  Use   Smoking status: Former    Types: Cigarettes   Smokeless tobacco: Never  Vaping Use   Vaping Use: Never used  Substance and Sexual Activity   Alcohol use: Yes    Comment: twice montly   Drug use: Never   Sexual activity: Yes  Other Topics Concern   Not on file  Social History Narrative   Not on file   Social Determinants of Health   Financial Resource Strain: Not on file  Food Insecurity: Not on file  Transportation Needs: Not on file  Physical Activity: Not on file  Stress: Not on file  Social Connections: Not on file  Intimate Partner Violence: Not on file    Outpatient Medications Prior to Visit  Medication Sig Dispense Refill   ALPRAZolam (XANAX) 0.5 MG tablet May take one tablet one hour prior to flying. 12 tablet 0   Ascorbic Acid (VITAMIN C PO) Take 1,000 mg by mouth daily.     cetirizine (ZYRTEC) 10 MG tablet Take 10 mg by mouth daily.  Fluticasone Propionate (FLONASE NA) Place into the nose. PRN     levonorgestrel (MIRENA) 20 MCG/24HR IUD 1 each by Intrauterine route once.     Multiple Vitamin (MULTIVITAMIN) tablet Take 1 tablet by mouth daily.       NON FORMULARY CBD as needed     NON FORMULARY Smooth daily- PRN     Polyethyl Glycol-Propyl Glycol (SYSTANE OP) Apply to eye. daily     Probiotic Product (PROBIOTIC PO) Take by mouth daily.     VITAMIN D PO Take by mouth daily.     Wheat Dextrin (BENEFIBER PO) Take by mouth. With fiber daily     buPROPion (WELLBUTRIN XL) 150 MG 24 hr tablet TAKE 1 TABLET BY MOUTH EVERY DAY 90 tablet 0   FLUoxetine (PROZAC) 40 MG capsule TAKE 1 CAPSULE BY MOUTH EVERY DAY 30 capsule 0   losartan (COZAAR) 25 MG tablet TAKE 2 TABLETS BY MOUTH EVERY DAY 180 tablet 0   methocarbamol (ROBAXIN) 500 MG tablet Take 1 tablet (500 mg total) by mouth 2 (two) times daily. 14 tablet 0   QSYMIA 3.75-23 MG CP24 Take 1 capsule by mouth daily.  0   telmisartan (MICARDIS) 40 MG tablet Take 1 tablet (40 mg total) by mouth daily. 90 tablet 1    No facility-administered medications prior to visit.    Allergies  Allergen Reactions   Codeine Sulfate     REACTION: anxiety    ROS Review of Systems  Constitutional:  Negative for fatigue and unexpected weight change.  Eyes:  Negative for visual disturbance.  Respiratory:  Negative for cough, chest tightness, shortness of breath and wheezing.   Cardiovascular:  Negative for chest pain, palpitations and leg swelling.  Endocrine: Negative for polydipsia and polyuria.  Neurological:  Negative for dizziness, seizures, syncope, weakness, light-headedness and headaches.     Objective:    Physical Exam Constitutional:      Appearance: She is well-developed.  Eyes:     Pupils: Pupils are equal, round, and reactive to light.  Neck:     Thyroid: No thyromegaly.     Vascular: No JVD.  Cardiovascular:     Rate and Rhythm: Normal rate and regular rhythm.     Heart sounds:    No gallop.  Pulmonary:     Effort: Pulmonary effort is normal. No respiratory distress.     Breath sounds: Normal breath sounds. No wheezing or rales.  Musculoskeletal:     Cervical back: Neck supple.  Neurological:     Mental Status: She is alert.    BP 124/70 (BP Location: Left Arm, Patient Position: Sitting, Cuff Size: Normal)   Pulse 80   Temp 98 F (36.7 C) (Oral)   Ht 5' 5.5" (1.664 m)   Wt 257 lb 14.4 oz (117 kg)   SpO2 97%   BMI 42.26 kg/m  Wt Readings from Last 3 Encounters:  07/01/21 257 lb 14.4 oz (117 kg)  03/31/20 248 lb 1.6 oz (112.5 kg)  01/24/20 252 lb 3.2 oz (114.4 kg)     Health Maintenance Due  Topic Date Due   HIV Screening  Never done   Hepatitis C Screening  Never done   COLONOSCOPY (Pts 45-58yrs Insurance coverage will need to be confirmed)  Never done   Zoster Vaccines- Shingrix (1 of 2) Never done   PAP SMEAR-Modifier  05/24/2020   COVID-19 Vaccine (4 - Booster for Pfizer series) 12/22/2020   INFLUENZA VACCINE  05/25/2021    There are no  preventive care  reminders to display for this patient.  Lab Results  Component Value Date   TSH 2.98 10/28/2017   Lab Results  Component Value Date   WBC 19.2 (H) 06/09/2019   HGB 15.5 (H) 06/09/2019   HCT 48.8 (H) 06/09/2019   MCV 93.7 06/09/2019   PLT 394 06/09/2019   Lab Results  Component Value Date   NA 138 06/09/2019   K 4.1 06/09/2019   CO2 25 06/09/2019   GLUCOSE 109 (H) 06/09/2019   BUN 21 (H) 06/09/2019   CREATININE 0.82 06/09/2019   BILITOT 0.6 10/28/2017   ALKPHOS 90 09/23/2014   AST 19 10/28/2017   ALT 21 10/28/2017   PROT 7.1 10/28/2017   ALBUMIN 4.4 09/23/2014   CALCIUM 10.0 06/09/2019   ANIONGAP 9 06/09/2019   GFR 65.29 09/23/2014   Lab Results  Component Value Date   CHOL 228 (H) 10/28/2017   Lab Results  Component Value Date   HDL 56 10/28/2017   Lab Results  Component Value Date   LDLCALC 150 (H) 10/28/2017   Lab Results  Component Value Date   TRIG 108 10/28/2017   Lab Results  Component Value Date   CHOLHDL 4.1 10/28/2017   Lab Results  Component Value Date   HGBA1C 5.8 (H) 10/28/2017      Assessment & Plan:   Problem List Items Addressed This Visit       Unprioritized   Hyperlipidemia   Relevant Medications   losartan (COZAAR) 50 MG tablet   Hypertension - Primary   Relevant Medications   losartan (COZAAR) 50 MG tablet   Chronic depression   Relevant Medications   FLUoxetine (PROZAC) 40 MG capsule   buPROPion (WELLBUTRIN XL) 150 MG 24 hr tablet  She has hypertension which is well controlled.  We refilled her losartan for 1 year  Advised that she consider setting up complete physical.  She needs multiple follow-up labs.  We offered to check lipids and A1c today but she declines and would like to schedule these fasting  Also reminded to set up colonoscopy  Depression is stable and we refilled her Wellbutrin and Prozac for 1 year  Meds ordered this encounter  Medications   losartan (COZAAR) 50 MG tablet    Sig: Take 1 tablet (50  mg total) by mouth daily.    Dispense:  90 tablet    Refill:  3   FLUoxetine (PROZAC) 40 MG capsule    Sig: Take 1 capsule (40 mg total) by mouth daily.    Dispense:  90 capsule    Refill:  3    Pt needs appt   buPROPion (WELLBUTRIN XL) 150 MG 24 hr tablet    Sig: Take 1 tablet (150 mg total) by mouth daily.    Dispense:  90 tablet    Refill:  3    Patient needs a physical for further refills.    Follow-up: No follow-ups on file.    Evelena Peat, MD

## 2021-09-04 ENCOUNTER — Other Ambulatory Visit: Payer: Self-pay | Admitting: Family Medicine

## 2021-10-27 ENCOUNTER — Other Ambulatory Visit: Payer: Self-pay | Admitting: Family Medicine

## 2022-03-15 ENCOUNTER — Ambulatory Visit (AMBULATORY_SURGERY_CENTER): Payer: Self-pay

## 2022-03-15 ENCOUNTER — Other Ambulatory Visit: Payer: Self-pay

## 2022-03-15 VITALS — Ht 65.5 in | Wt 265.0 lb

## 2022-03-15 DIAGNOSIS — Z1211 Encounter for screening for malignant neoplasm of colon: Secondary | ICD-10-CM

## 2022-03-15 MED ORDER — NA SULFATE-K SULFATE-MG SULF 17.5-3.13-1.6 GM/177ML PO SOLN
1.0000 | Freq: Once | ORAL | 0 refills | Status: AC
Start: 2022-03-15 — End: 2022-03-15

## 2022-03-15 NOTE — Progress Notes (Signed)
Denies allergies to eggs or soy products. Denies complication of anesthesia or sedation. Denies use of weight loss medication. Denies use of O2.   Emmi instructions given for colonoscopy.  

## 2022-03-28 ENCOUNTER — Encounter: Payer: Self-pay | Admitting: Certified Registered Nurse Anesthetist

## 2022-03-29 ENCOUNTER — Encounter: Payer: Self-pay | Admitting: Family Medicine

## 2022-03-29 LAB — HM MAMMOGRAPHY

## 2022-03-30 ENCOUNTER — Encounter: Payer: Self-pay | Admitting: Gastroenterology

## 2022-03-30 MED ORDER — BUPROPION HCL ER (XL) 300 MG PO TB24: 300.0000 mg | ORAL_TABLET | Freq: Every day | ORAL | 0 refills | Status: DC

## 2022-04-05 ENCOUNTER — Ambulatory Visit (AMBULATORY_SURGERY_CENTER): Payer: Managed Care, Other (non HMO) | Admitting: Gastroenterology

## 2022-04-05 ENCOUNTER — Encounter: Payer: Self-pay | Admitting: Gastroenterology

## 2022-04-05 VITALS — BP 118/64 | HR 72 | Temp 98.0°F | Resp 15 | Ht 65.5 in | Wt 265.0 lb

## 2022-04-05 DIAGNOSIS — Z1211 Encounter for screening for malignant neoplasm of colon: Secondary | ICD-10-CM | POA: Diagnosis present

## 2022-04-05 MED ORDER — SODIUM CHLORIDE 0.9 % IV SOLN: 500.0000 mL | Freq: Once | INTRAVENOUS | Status: DC

## 2022-04-05 NOTE — Progress Notes (Signed)
Report given to PACU, vss 

## 2022-04-05 NOTE — Progress Notes (Signed)
History and Physical:  This patient presents for endoscopic testing for: Encounter Diagnosis  Name Primary?   Special screening for malignant neoplasms, colon Yes    Average risk - first screening colonoscopy. Patient denies chronic abdominal pain, rectal bleeding, constipation or diarrhea.   Patient is otherwise without complaints or active issues today.   Past Medical History: Past Medical History:  Diagnosis Date   Allergy    Anxiety    Arthritis    Depression    Hyperlipidemia    Hypertension    Kidney stone      Past Surgical History: Past Surgical History:  Procedure Laterality Date   APPENDECTOMY     TONSILLECTOMY     WISDOM TOOTH EXTRACTION      Allergies: Allergies  Allergen Reactions   Codeine Sulfate     REACTION: anxiety    Outpatient Meds: Current Outpatient Medications  Medication Sig Dispense Refill   buPROPion (WELLBUTRIN XL) 300 MG 24 hr tablet Take 1 tablet (300 mg total) by mouth daily. 90 tablet 0   cetirizine (ZYRTEC) 10 MG tablet Take 10 mg by mouth daily.       FLUoxetine (PROZAC) 40 MG capsule Take 1 capsule (40 mg total) by mouth daily. 90 capsule 3   levonorgestrel (MIRENA) 20 MCG/DAY IUD 1 each by Intrauterine route once.     losartan (COZAAR) 50 MG tablet Take 1 tablet (50 mg total) by mouth daily. 90 tablet 3   Polyethyl Glycol-Propyl Glycol (SYSTANE OP) Apply to eye. daily     Fluticasone Propionate (FLONASE NA) Place into the nose. PRN     levonorgestrel (MIRENA) 20 MCG/24HR IUD 1 each by Intrauterine route once.     Multiple Vitamin (MULTIVITAMIN) tablet Take 1 tablet by mouth daily.       NON FORMULARY CBD as needed     NON FORMULARY Smooth daily- PRN     OVER THE COUNTER MEDICATION Vitamin B Complex, one tablet daily.     OVER THE COUNTER MEDICATION Magnesium , one tablet two times a day.     Probiotic Product (PROBIOTIC PO) Take by mouth daily.     VITAMIN D PO Take by mouth daily.     Wheat Dextrin (BENEFIBER PO) Take by  mouth. With fiber daily (Patient not taking: Reported on 03/15/2022)     Current Facility-Administered Medications  Medication Dose Route Frequency Provider Last Rate Last Admin   0.9 %  sodium chloride infusion  500 mL Intravenous Once Sherrilyn Rist, MD          ___________________________________________________________________ Objective   Exam:  BP 126/68 (BP Location: Right Arm, Patient Position: Sitting, Cuff Size: Large)   Pulse 88   Temp 98 F (36.7 C) (Temporal)   Ht 5' 5.5" (1.664 m)   Wt 265 lb (120.2 kg)   SpO2 96%   BMI 43.43 kg/m   CV: RRR without murmur, S1/S2 Resp: clear to auscultation bilaterally, normal RR and effort noted GI: soft, no tenderness, with active bowel sounds.   Assessment: Encounter Diagnosis  Name Primary?   Special screening for malignant neoplasms, colon Yes     Plan: Colonoscopy  The benefits and risks of the planned procedure were described in detail with the patient or (when appropriate) their health care proxy.  Risks were outlined as including, but not limited to, bleeding, infection, perforation, adverse medication reaction leading to cardiac or pulmonary decompensation, pancreatitis (if ERCP).  The limitation of incomplete mucosal visualization was also discussed.  No guarantees or warranties were given.    The patient is appropriate for an endoscopic procedure in the ambulatory setting.   - Wilfrid Lund, MD

## 2022-04-05 NOTE — Progress Notes (Deleted)
Called to room to assist during endoscopic procedure.  Patient ID and intended procedure confirmed with present staff. Received instructions for my participation in the procedure from the performing physician.  

## 2022-04-05 NOTE — Progress Notes (Signed)
Vitals-DT  Pt's states no medical or surgical changes since previsit or office visit.  

## 2022-04-05 NOTE — Op Note (Signed)
[facility] Patient Name: Gina Vargas Procedure Date: 04/05/2022 8:06 AM MRN: [id number] Endoscopist: Sherilyn Cooter L. Myrtie Neither , MD Age: 51 Referring MD:  Date of Birth: [birth year] Gender: Female Account #: [id number] Procedure:                Colonoscopy Indications:              Screening for colorectal malignant neoplasm, This                            is the patient's first colonoscopy Medicines:                Monitored Anesthesia Care Procedure:                Pre-Anesthesia Assessment:                           - Prior to the procedure, a History and Physical                            was performed, and patient medications and                            allergies were reviewed. The patient's tolerance of                            previous anesthesia was also reviewed. The risks                            and benefits of the procedure and the sedation                            options and risks were discussed with the patient.                            All questions were answered, and informed consent                            was obtained. Prior Anticoagulants: The patient has                            taken no previous anticoagulant or antiplatelet                            agents. ASA Grade Assessment: III - A patient with                            severe systemic disease. After reviewing the risks                            and benefits, the patient was deemed in                            satisfactory condition to undergo the procedure.  After obtaining informed consent, the colonoscope                            was passed under direct vision. Throughout the                            procedure, the patient's blood pressure, pulse, and                            oxygen saturations were monitored continuously. The                            CF [id number] #[id number] was introduced through the anus                            and advanced to the  the cecum, identified by                            appendiceal orifice and ileocecal valve. The                            colonoscopy was somewhat difficult due to a                            redundant colon. Successful completion of the                            procedure was aided by using manual pressure and                            straightening and shortening the scope to obtain                            bowel loop reduction. The patient tolerated the                            procedure well. The quality of the bowel                            preparation was excellent. The ileocecal valve,                            appendiceal orifice, and rectum were photographed.                            The bowel preparation used was SUPREP. Scope In: 8:10:14 AM Scope Out: 8:26:27 AM Scope Withdrawal Time: 0 hours 12 minutes 11 seconds  Total Procedure Duration: 0 hours 16 minutes 13 seconds  Findings:                 The perianal and digital rectal examinations were                            normal.  Repeat examination of right colon under NBI                            performed.                           A few diverticula were found in the left colon.                           Internal hemorrhoids were found. The hemorrhoids                            were small.                           The exam was otherwise without abnormality on                            direct and retroflexion views. Complications:            No immediate complications. Estimated Blood Loss:     Estimated blood loss: none. Impression:               - Diverticulosis in the left colon.                           - Internal hemorrhoids.                           - The examination was otherwise normal on direct                            and retroflexion views.                           - No specimens collected. Recommendation:           - Patient has a contact number available for                             emergencies. The signs and symptoms of potential                            delayed complications were discussed with the                            patient. Return to normal activities tomorrow.                            Written discharge instructions were provided to the                            patient.                           - Resume previous diet.                           -  Continue present medications.                           - Repeat colonoscopy in 10 years for screening                            purposes. Jourdin Gens L. Myrtie Neither, MD 04/05/2022 8:29:30 AM This report has been signed electronically.

## 2022-04-05 NOTE — Patient Instructions (Signed)
Handouts given for diverticulosis and high fiber diet.  YOU HAD AN ENDOSCOPIC PROCEDURE TODAY AT THE [facility]:   Refer to the procedure report that was given to you for any specific questions about what was found during the examination.  If the procedure report does not answer your questions, please call your gastroenterologist to clarify.  If you requested that your care partner not be given the details of your procedure findings, then the procedure report has been included in a sealed envelope for you to review at your convenience later.  YOU SHOULD EXPECT: Some feelings of bloating in the abdomen. Passage of more gas than usual.  Walking can help get rid of the air that was put into your GI tract during the procedure and reduce the bloating. If you had a lower endoscopy (such as a colonoscopy or flexible sigmoidoscopy) you may notice spotting of blood in your stool or on the toilet paper. If you underwent a bowel prep for your procedure, you may not have a normal bowel movement for a few days.  Please Note:  You might notice some irritation and congestion in your nose or some drainage.  This is from the oxygen used during your procedure.  There is no need for concern and it should clear up in a day or so.  SYMPTOMS TO REPORT IMMEDIATELY:  Following lower endoscopy (colonoscopy):  Excessive amounts of blood in the stool  Significant tenderness or worsening of abdominal pains  Swelling of the abdomen that is new, acute  Fever of 100F or higher  For urgent or emergent issues, a gastroenterologist can be reached at any hour by calling [phone]. Do not use MyChart messaging for urgent concerns.    DIET:  We do recommend a small meal at first, but then you may proceed to your regular diet.  Drink plenty of fluids but you should avoid alcoholic beverages for 24 hours.  ACTIVITY:  You should plan to take it easy for the rest of today and you should NOT DRIVE or use heavy  machinery until tomorrow (because of the sedation medicines used during the test).    FOLLOW UP: Our staff will call the number listed on your records 24-72 hours following your procedure to check on you and address any questions or concerns that you may have regarding the information given to you following your procedure. If we do not reach you, we will leave a message.  We will attempt to reach you two times.  During this call, we will ask if you have developed any symptoms of COVID 19. If you develop any symptoms (ie: fever, flu-like symptoms, shortness of breath, cough etc.) before then, please call [phone].  If you test positive for Covid 19 in the 2 weeks post procedure, please call and report this information to [place].    There were no polyps seen today!  You will need another screening colonoscopy in 10 years, you will receive a letter at that time when you are due for the procedure.   Please call us at [phone] if you have a change in bowel habits, change in family history of colo-rectal cancer, rectal bleeding or other GI concern before that time.    SIGNATURES/CONFIDENTIALITY: You and/or your care partner have signed paperwork which will be entered into your electronic medical record.  These signatures attest to the fact that that the information above on your After Visit Summary has been reviewed and is understood.  Full responsibility of  the confidentiality of this discharge information lies with you and/or your care-partner.

## 2022-04-06 ENCOUNTER — Telehealth: Payer: Self-pay | Admitting: *Deleted

## 2022-04-06 NOTE — Telephone Encounter (Signed)
  Follow up Call-     04/05/2022    7:25 AM  Call back number  Post procedure Call Back phone  # [phone]  Permission to leave phone message Yes     Patient questions:  Do you have a fever, pain , or abdominal swelling? No. Pain Score  0 *  Have you tolerated food without any problems? Yes.    Have you been able to return to your normal activities? Yes.    Do you have any questions about your discharge instructions: Diet   No. Medications  No. Follow up visit  No.  Do you have questions or concerns about your Care? No.  Actions: * If pain score is 4 or above: No action needed, pain <4.

## 2022-04-28 ENCOUNTER — Other Ambulatory Visit: Payer: Self-pay | Admitting: Family Medicine

## 2022-05-03 ENCOUNTER — Encounter: Payer: Self-pay | Admitting: Family Medicine

## 2022-05-03 ENCOUNTER — Ambulatory Visit (INDEPENDENT_AMBULATORY_CARE_PROVIDER_SITE_OTHER): Payer: Managed Care, Other (non HMO) | Admitting: Family Medicine

## 2022-05-03 VITALS — BP 136/82 | HR 79 | Temp 97.9°F | Ht 65.5 in | Wt 262.1 lb

## 2022-05-03 DIAGNOSIS — F32A Depression, unspecified: Secondary | ICD-10-CM | POA: Diagnosis not present

## 2022-05-03 DIAGNOSIS — I10 Essential (primary) hypertension: Secondary | ICD-10-CM

## 2022-05-03 MED ORDER — FLUOXETINE HCL 40 MG PO CAPS
40.0000 mg | ORAL_CAPSULE | Freq: Every day | ORAL | 3 refills | Status: DC
Start: 2022-05-03 — End: 2023-05-09

## 2022-05-03 NOTE — Progress Notes (Signed)
Established Patient Office Visit  Subjective   Patient ID: Gina Vargas, female    DOB: 10/04/1971  Age: 51 y.o. MRN: [id number]  Chief Complaint  Patient presents with   Follow-up    HPI   Longstanding history of recurrent depression.  She had called recently with complaints of low motivation and low energy.  She is on Prozac 40 mg daily and had been on Wellbutrin XL 150 mg daily.  She had inquired about increasing her Prozac dose.  Because of her low motivation and low energy we had recommended instead increasing Wellbutrin to 300 mg daily.  She has seen some improvement in terms of overall motivation and initiative.  Tolerating well with no side effects.  She is pleased with the change thus far.  She does think she is going through perimenopause.  No significant hot flashes but has some irritability.  She sees GYN and has IUD in place.  Hypertension treated with losartan 50 mg daily.  Not monitoring blood pressures regularly.  No recent dizziness or lightheadedness.  Past Medical History:  Diagnosis Date   Allergy    Anxiety    Arthritis    Depression    Hyperlipidemia    Hypertension    Kidney stone    Past Surgical History:  Procedure Laterality Date   APPENDECTOMY     TONSILLECTOMY     WISDOM TOOTH EXTRACTION      reports that she has quit smoking. Her smoking use included cigarettes. She has never used smokeless tobacco. She reports current alcohol use. She reports that she does not use drugs. family history includes Arthritis in an other family member; Cancer in an other family member; Colon polyps in her mother; Coronary artery disease in her maternal grandfather; Diabetes in her father; Heart disease in an other family member; Hyperlipidemia in an other family member; Hypertension in an other family member. Allergies  Allergen Reactions   Codeine Sulfate     REACTION: anxiety    Review of Systems  Eyes:  Negative for blurred vision.  Respiratory:  Negative  for shortness of breath.   Cardiovascular:  Negative for chest pain.  Gastrointestinal:  Negative for abdominal pain.  Neurological:  Negative for dizziness, weakness and headaches.      Objective:     BP 136/82 (BP Location: Left Arm, Patient Position: Sitting, Cuff Size: Large)   Pulse 79   Temp 97.9 F (36.6 C) (Oral)   Ht 5' 5.5" (1.664 m)   Wt 262 lb 1.6 oz (118.9 kg)   SpO2 98%   BMI 42.95 kg/m  BP Readings from Last 3 Encounters:  05/03/22 136/82  04/05/22 118/64  07/01/21 124/70   Wt Readings from Last 3 Encounters:  05/03/22 262 lb 1.6 oz (118.9 kg)  04/05/22 265 lb (120.2 kg)  03/15/22 265 lb (120.2 kg)      Physical Exam Vitals reviewed.  Cardiovascular:     Rate and Rhythm: Normal rate and regular rhythm.  Pulmonary:     Effort: Pulmonary effort is normal.     Breath sounds: Normal breath sounds. No wheezing or rales.  Musculoskeletal:     Right lower leg: No edema.     Left lower leg: No edema.  Neurological:     General: No focal deficit present.     Mental Status: She is alert.      No results found for any visits on 05/03/22.    The ASCVD Risk score (Arnett DK, et al.,  2019) failed to calculate for the following reasons:   Cannot find a previous HDL lab   Cannot find a previous total cholesterol lab    Assessment & Plan:   #1 chronic/recurrent depression.  Improved symptomatology after increasing Wellbutrin to 300 mg daily.  Continue Prozac 40 mg daily with refills provided for 1 year and Wellbutrin XL 300 mg daily  #2 hypertension-stable.  Continue losartan 50 mg daily.  Continue weight loss efforts   No follow-ups on file.    Evelena Peat, MD

## 2022-06-23 ENCOUNTER — Other Ambulatory Visit: Payer: Self-pay | Admitting: Family Medicine

## 2022-06-24 ENCOUNTER — Other Ambulatory Visit: Payer: Self-pay | Admitting: Family Medicine

## 2022-09-16 ENCOUNTER — Telehealth: Payer: Managed Care, Other (non HMO) | Admitting: Family Medicine

## 2022-09-16 ENCOUNTER — Emergency Department (HOSPITAL_BASED_OUTPATIENT_CLINIC_OR_DEPARTMENT_OTHER)
Admission: EM | Admit: 2022-09-16 | Discharge: 2022-09-16 | Disposition: A | Discharge status: Home / Self Care | Payer: Managed Care, Other (non HMO) | Attending: Emergency Medicine | Admitting: Emergency Medicine

## 2022-09-16 ENCOUNTER — Encounter (HOSPITAL_BASED_OUTPATIENT_CLINIC_OR_DEPARTMENT_OTHER): Payer: Self-pay | Admitting: Emergency Medicine

## 2022-09-16 ENCOUNTER — Other Ambulatory Visit: Payer: Self-pay

## 2022-09-16 ENCOUNTER — Emergency Department (HOSPITAL_BASED_OUTPATIENT_CLINIC_OR_DEPARTMENT_OTHER): Payer: Managed Care, Other (non HMO)

## 2022-09-16 DIAGNOSIS — K529 Noninfective gastroenteritis and colitis, unspecified: Secondary | ICD-10-CM | POA: Insufficient documentation

## 2022-09-16 DIAGNOSIS — D72829 Elevated white blood cell count, unspecified: Secondary | ICD-10-CM | POA: Insufficient documentation

## 2022-09-16 DIAGNOSIS — R1030 Lower abdominal pain, unspecified: Secondary | ICD-10-CM | POA: Diagnosis present

## 2022-09-16 DIAGNOSIS — R109 Unspecified abdominal pain: Secondary | ICD-10-CM

## 2022-09-16 LAB — COMPREHENSIVE METABOLIC PANEL
ALT: 20 U/L (ref 0–44)
AST: 14 U/L — ABNORMAL LOW (ref 15–41)
Albumin: 4.5 g/dL (ref 3.5–5.0)
Alkaline Phosphatase: 85 U/L (ref 38–126)
Anion gap: 13 (ref 5–15)
BUN: 18 mg/dL (ref 6–20)
CO2: 24 mmol/L (ref 22–32)
Calcium: 10.1 mg/dL (ref 8.9–10.3)
Chloride: 100 mmol/L (ref 98–111)
Creatinine, Ser: 0.85 mg/dL (ref 0.44–1.00)
GFR, Estimated: >60 mL/min (ref >60)
Glucose, Bld: 120 mg/dL — ABNORMAL HIGH (ref 70–99)
Potassium: 4.5 mmol/L (ref 3.5–5.1)
Sodium: 137 mmol/L (ref 135–145)
Total Bilirubin: 0.7 mg/dL (ref 0.3–1.2)
Total Protein: 7.4 g/dL (ref 6.5–8.1)

## 2022-09-16 LAB — CBC
HCT: 43.2 % (ref 36.0–46.0)
Hemoglobin: 14.1 g/dL (ref 12.0–15.0)
MCH: 29.2 pg (ref 26.0–34.0)
MCHC: 32.6 g/dL (ref 30.0–36.0)
MCV: 89.4 fL (ref 80.0–100.0)
Platelets: 380 10*3/uL (ref 150–400)
RBC: 4.83 MIL/uL (ref 3.87–5.11)
RDW: 13.2 % (ref 11.5–15.5)
WBC: 16.8 10*3/uL — ABNORMAL HIGH (ref 4.0–10.5)
nRBC: 0 % (ref 0.0–0.2)

## 2022-09-16 LAB — LIPASE, BLOOD: Lipase: 11 U/L (ref 11–51)

## 2022-09-16 MED ORDER — HYDROCODONE-ACETAMINOPHEN 5-325 MG PO TABS
1.0000 | ORAL_TABLET | Freq: Once | ORAL | Status: AC
  Administered 2022-09-16: 1 via ORAL
  Filled 2022-09-16: qty 1

## 2022-09-16 MED ORDER — DICYCLOMINE HCL 20 MG PO TABS: 20.0000 mg | ORAL_TABLET | Freq: Two times a day (BID) | ORAL | 0 refills | Status: AC

## 2022-09-16 MED ORDER — MORPHINE SULFATE (PF) 4 MG/ML IV SOLN
4.0000 mg | Freq: Once | INTRAVENOUS | Status: AC
  Administered 2022-09-16: 4 mg via INTRAVENOUS
  Filled 2022-09-16: qty 1

## 2022-09-16 MED ORDER — ONDANSETRON HCL 4 MG/2ML IJ SOLN
4.0000 mg | Freq: Once | INTRAMUSCULAR | Status: AC
  Administered 2022-09-16: 4 mg via INTRAVENOUS
  Filled 2022-09-16: qty 2

## 2022-09-16 MED ORDER — ACETAMINOPHEN 500 MG PO TABS: 500.0000 mg | ORAL_TABLET | Freq: Four times a day (QID) | ORAL | 0 refills | Status: AC | PRN

## 2022-09-16 MED ORDER — IOHEXOL 300 MG/ML  SOLN
100.0000 mL | Freq: Once | INTRAMUSCULAR | Status: AC | PRN
  Administered 2022-09-16: 100 mL via INTRAVENOUS

## 2022-09-16 MED ORDER — SODIUM CHLORIDE 0.9 % IV BOLUS
500.0000 mL | Freq: Once | INTRAVENOUS | Status: AC
  Administered 2022-09-16: 500 mL via INTRAVENOUS

## 2022-09-16 MED ORDER — ONDANSETRON 8 MG PO TBDP: 8.0000 mg | ORAL_TABLET | Freq: Three times a day (TID) | ORAL | 0 refills | Status: DC | PRN

## 2022-09-16 MED ORDER — DICYCLOMINE HCL 10 MG PO CAPS
10.0000 mg | ORAL_CAPSULE | Freq: Once | ORAL | Status: AC
  Administered 2022-09-16: 10 mg via ORAL
  Filled 2022-09-16: qty 1

## 2022-09-16 NOTE — Discharge Instructions (Signed)
You were seen in the emergency room for abdominal pain.  CT scan confirms that you have colitis.  In the setting of no blood in your stool, no fevers and no immunosuppression -antibiotics are not indicated.  We recommend that you take stool softeners, enjoy simple foods that are easy to digest.  Return to the ER if you start having fevers, bloody stools, worsening pain.

## 2022-09-16 NOTE — ED Provider Notes (Signed)
[facility] EMERGENCY DEPT Provider Note   CSN: [id number] Arrival date & time: 09/16/22  1104     History  Chief Complaint  Patient presents with   Abdominal Pain    Gina Vargas is a 51 y.o. female.  HPI     51 year old female comes in with chief complaint of abdominal pain.  Patient has history of appendectomy and metabolic syndrome.  She also has history of kidney stone.  She states that yesterday she started having some abdominal discomfort.  Overnight she started having worsening pain, that became excruciating this morning.  She has not had a normal bowel movement in 2 days.  She does not think she is passing flatus.  Pain is located primarily in the lower quadrants.  She denies any burning with urination, blood in the urine, vaginal discharge or bleeding.  Home Medications Prior to Admission medications   Medication Sig Start Date End Date Taking? Authorizing Provider  acetaminophen (TYLENOL) 500 MG tablet Take 1 tablet (500 mg total) by mouth every 6 (six) hours as needed. 09/16/22  Yes Derwood Kaplan, MD  dicyclomine (BENTYL) 20 MG tablet Take 1 tablet (20 mg total) by mouth 2 (two) times daily. 09/16/22  Yes Armando Bukhari, MD  ondansetron (ZOFRAN-ODT) 8 MG disintegrating tablet Take 1 tablet (8 mg total) by mouth every 8 (eight) hours as needed for nausea. 09/16/22  Yes Khaleed Holan, MD  buPROPion (WELLBUTRIN XL) 300 MG 24 hr tablet TAKE 1 TABLET BY MOUTH EVERY DAY 06/24/22   Burchette, Elberta Fortis, MD  cetirizine (ZYRTEC) 10 MG tablet Take 10 mg by mouth daily.      [provider]  FLUoxetine (PROZAC) 40 MG capsule Take 1 capsule (40 mg total) by mouth daily. 05/03/22   Burchette, Elberta Fortis, MD  Fluticasone Propionate (FLONASE NA) Place into the nose. PRN    [provider]  levonorgestrel (MIRENA) 20 MCG/DAY IUD 1 each by Intrauterine route once.    [provider]  losartan (COZAAR) 50 MG tablet TAKE 1 TABLET BY MOUTH EVERY  DAY 06/23/22   Burchette, Elberta Fortis, MD  Multiple Vitamin (MULTIVITAMIN) tablet Take 1 tablet by mouth daily.      [provider]  NON FORMULARY CBD as needed    [provider]  NON FORMULARY Smooth daily- PRN    [provider]  OVER THE COUNTER MEDICATION Vitamin B Complex, one tablet daily.    [provider]  OVER THE COUNTER MEDICATION Magnesium , one tablet two times a day.    [provider]  Polyethyl Glycol-Propyl Glycol (SYSTANE OP) Apply to eye. daily    [provider]  Probiotic Product (PROBIOTIC PO) Take by mouth daily.    [provider]  VITAMIN D PO Take by mouth daily.    [provider]  Wheat Dextrin (BENEFIBER PO) Take by mouth. With fiber daily    [provider]      Allergies    Codeine sulfate    Review of Systems   Review of Systems  Physical Exam Updated Vital Signs BP (!) 141/71 (BP Location: Left Arm)   Pulse 82   Temp 97.6 F (36.4 C) (Oral)   Resp 16   SpO2 100%  Physical Exam Vitals and nursing note reviewed.  Constitutional:      Appearance: She is well-developed.  HENT:     Head: Atraumatic.  Cardiovascular:     Rate and Rhythm: Normal rate.  Pulmonary:  Effort: Pulmonary effort is normal.  Abdominal:     Tenderness: There is abdominal tenderness in the right lower quadrant, suprapubic area and left lower quadrant. There is no guarding or rebound.  Musculoskeletal:     Cervical back: Normal range of motion and neck supple.  Skin:    General: Skin is warm and dry.  Neurological:     Mental Status: She is alert and oriented to person, place, and time.     ED Results / Procedures / Treatments   Labs (all labs ordered are listed, but only abnormal results are displayed) Labs Reviewed  COMPREHENSIVE METABOLIC PANEL - Abnormal; Notable for the following components:      Result Value   Glucose, Bld 120 (*)    AST 14 (*)    All other components within  normal limits  CBC - Abnormal; Notable for the following components:   WBC 16.8 (*)    All other components within normal limits  LIPASE, BLOOD    EKG EKG Interpretation  Date/Time:  Thursday September 16 2022 11:46:50 EST Ventricular Rate:  88 PR Interval:  152 QRS Duration: 88 QT Interval:  384 QTC Calculation: 464 R Axis:   115 Text Interpretation: Normal sinus rhythm Left posterior fascicular block Abnormal ECG No previous ECGs available No acute changes No old tracing to compare Confirmed by Derwood Kaplan [phone]) on 09/16/2022 2:01:06 PM  Radiology No results found.  Procedures Procedures    Medications Ordered in ED Medications  dicyclomine (BENTYL) capsule 10 mg (has no administration in time range)  HYDROcodone-acetaminophen (NORCO/VICODIN) 5-325 MG per tablet 1 tablet (has no administration in time range)  iohexol (OMNIPAQUE) 300 MG/ML solution 100 mL (100 mLs Intravenous Contrast Given 09/16/22 1319)  sodium chloride 0.9 % bolus 500 mL (500 mLs Intravenous New Bag/Given 09/16/22 1345)  morphine (PF) 4 MG/ML injection 4 mg (4 mg Intravenous Given 09/16/22 1346)  ondansetron (ZOFRAN) injection 4 mg (4 mg Intravenous Given 09/16/22 1354)    ED Course/ Medical Decision Making/ A&P                           Medical Decision Making This patient presents to the ED with chief complaint(s) of worsening abdominal pain for 1 day with irregular bowel movement, constipation with pertinent past medical history of appendectomy and metabolic syndrome, kidney stones.The complaint involves an extensive differential diagnosis and also carries with it a high risk of complications and morbidity.    The differential diagnosis includes : Early small bowel obstruction, partial SBO, ileus, constipation, diverticulitis, cystitis.  The initial plan is to get basic labs including urine analysis and proceed with CT abdomen pelvis.  Patient does not have any UTI-like symptoms, making primary  diagnosis of UTI less likely.   Additional history obtained: Additional history obtained from significant other   Independent labs interpretation:  The following labs were independently interpreted: CBC shows elevated white count.  Independent visualization and interpretation of imaging: - I independently visualized the following imaging with scope of interpretation limited to determining acute life threatening conditions related to emergency care: CT scan of the abdomen, which revealed no evidence of small bowel obstruction  Treatment and Reassessment: Patient reassessed.  After morphine, her pain is pretty much resolved.  She is feeling a lot better.  She has tolerated p.o. thus far.  IV fluid has been given.  CT scan shows colitis.  Patient denies any fevers, bloody stools.  No recent antibiotic.  She is immunocompetent -discussed with her that the benefits do not outweigh the risk of antibiotics and discussed return precautions with her.  She is stable for discharge.  Advised PCP follow-up in 7 days.    Problems Addressed: Colitis: acute illness or injury  Amount and/or Complexity of Data Reviewed Labs: ordered. Radiology: ordered.  Risk OTC drugs. Prescription drug management.     Final Clinical Impression(s) / ED Diagnoses Final diagnoses:  Colitis    Rx / DC Orders ED Discharge Orders          Ordered    ondansetron (ZOFRAN-ODT) 8 MG disintegrating tablet  Every 8 hours PRN        09/16/22 1509    acetaminophen (TYLENOL) 500 MG tablet  Every 6 hours PRN        09/16/22 1509    dicyclomine (BENTYL) 20 MG tablet  2 times daily        09/16/22 1509              Derwood Kaplan, MD 09/16/22 1510

## 2022-09-16 NOTE — Patient Instructions (Addendum)
Please go to the closest Urgent Care to your location.  I hope you feel better soon.  Happy Holidays  With [employer]

## 2022-09-16 NOTE — Progress Notes (Signed)
Gina Vargas  Patient with abdominal pain that is taking breath away, constant, constipated, with nausea. Needs in person to assess bowel sounds, pain location, and possible imagining.  Directed to UC closest to her.   Patient acknowledged agreement and understanding of the plan.

## 2022-09-16 NOTE — ED Triage Notes (Signed)
Lower abdominal pain, sharp pain, started some yesterday but worse today. Nauseated,no vomiting

## 2022-09-16 NOTE — ED Notes (Signed)
Pt on bedpan.

## 2022-09-20 ENCOUNTER — Telehealth: Payer: Self-pay

## 2022-09-20 NOTE — Telephone Encounter (Signed)
Transition Care Management Unsuccessful Follow-up Telephone Call  Date of discharge and from where: [facility] 09-16-22 Dx SBO    Attempts:  1st Attempt  Reason for unsuccessful TCM follow-up call:  Left voice message   Woodfin Ganja LPN [facility] Nurse Health Advisor Direct Dial [phone]

## 2022-09-21 NOTE — Telephone Encounter (Signed)
Transition Care Management Follow-up Telephone Call Date of discharge and from where:   [facility] 09-16-22 Dx SBO   How have you been since you were released from the hospital? Doing much better  Any questions or concerns? No  Items Reviewed: Did the pt receive and understand the discharge instructions provided? Yes  Medications obtained and verified? Yes  Other? no Any new allergies since your discharge? No  Dietary orders reviewed? Yes Do you have support at home? Yes   Home Care and Equipment/Supplies: Were home health services ordered? not applicable If so, what is the name of the agency? na  Has the agency set up a time to come to the patient's home? not applicable Were any new equipment or medical supplies ordered?  No What is the name of the medical supply agency? na Were you able to get the supplies/equipment? not applicable Do you have any questions related to the use of the equipment or supplies? No  Functional Questionnaire: (I = Independent and D = Dependent) ADLs: I  Bathing/Dressing- I  Meal Prep- I  Eating- I  Maintaining continence- I  Transferring/Ambulation- I  Managing Meds- I  Follow up appointments reviewed:  PCP Hospital f/u appt confirmed? Yes  Scheduled to see Dr Caryl Never on 09-24-22 @ 315pm. Specialist [facility] f/u appt confirmed? No  . Are transportation arrangements needed? No  If their condition worsens, is the pt aware to call PCP or go to the Emergency Dept.? Yes Was the patient provided with contact information for the PCP's office or ED? Yes Was to pt encouraged to call back with questions or concerns? Yes   Woodfin Ganja LPN [facility] Nurse Health Advisor Direct Dial [phone]

## 2022-09-22 ENCOUNTER — Other Ambulatory Visit: Payer: Self-pay | Admitting: Family Medicine

## 2022-09-24 ENCOUNTER — Inpatient Hospital Stay: Payer: Managed Care, Other (non HMO) | Admitting: Family Medicine

## 2022-09-24 ENCOUNTER — Ambulatory Visit: Payer: Managed Care, Other (non HMO) | Admitting: Family Medicine

## 2022-12-21 ENCOUNTER — Other Ambulatory Visit: Payer: Self-pay | Admitting: Family Medicine

## 2023-03-21 ENCOUNTER — Other Ambulatory Visit: Payer: Self-pay | Admitting: Family Medicine

## 2023-04-14 ENCOUNTER — Telehealth: Payer: Managed Care, Other (non HMO) | Admitting: Physician Assistant

## 2023-04-14 DIAGNOSIS — L255 Unspecified contact dermatitis due to plants, except food: Secondary | ICD-10-CM

## 2023-04-14 MED ORDER — PREDNISONE 10 MG PO TABS: ORAL_TABLET | ORAL | 0 refills | Status: DC

## 2023-04-14 MED ORDER — TRIAMCINOLONE ACETONIDE 0.1 % EX CREA: 1.0000 | TOPICAL_CREAM | Freq: Two times a day (BID) | CUTANEOUS | 0 refills | Status: AC

## 2023-04-14 NOTE — Progress Notes (Signed)
Virtual Visit Consent   Gina Vargas, you are scheduled for a virtual visit with a [facility] provider today. Just as with appointments in the office, your consent must be obtained to participate. Your consent will be active for this visit and any virtual visit you may have with one of our providers in the next 365 days. If you have a MyChart account, a copy of this consent can be sent to you electronically.  As this is a virtual visit, video technology does not allow for your provider to perform a traditional examination. This may limit your provider's ability to fully assess your condition. If your provider identifies any concerns that need to be evaluated in person or the need to arrange testing (such as labs, EKG, etc.), we will make arrangements to do so. Although advances in technology are sophisticated, we cannot ensure that it will always work on either your end or our end. If the connection with a video visit is poor, the visit may have to be switched to a telephone visit. With either a video or telephone visit, we are not always able to ensure that we have a secure connection.  By engaging in this virtual visit, you consent to the provision of healthcare and authorize for your insurance to be billed (if applicable) for the services provided during this visit. Depending on your insurance coverage, you may receive a charge related to this service.  I need to obtain your verbal consent now. Are you willing to proceed with your visit today? Gina Vargas has provided verbal consent on 04/14/2023 for a virtual visit (video or telephone). Gina Loveless, PA-C  Date: 04/14/2023 8:31 AM  Virtual Visit via Video Note   I, Gina Vargas, connected with  BEVELY Vargas  ([id number], 08-12-1971) on 04/14/23 at  8:30 AM EDT by a video-enabled telemedicine application and verified that I am speaking with the correct person using two identifiers.  Location: Patient: Virtual Visit Location  Patient: Work; isolated Provider: Virtual Visit Location Provider: Home Office   I discussed the limitations of evaluation and management by telemedicine and the availability of in person appointments. The patient expressed understanding and agreed to proceed.    History of Present Illness: Gina Vargas is a 52 y.o. who identifies as a female who was assigned female at birth, and is being seen today for poison oak rash.  HPI: Poison [place]ers This is a new problem. The current episode started in the past 7 days. The rash is diffuse. The rash is characterized by blistering, itchiness and redness. She was exposed to plant contact. Pertinent negatives include no congestion, facial edema, fatigue, fever, joint pain or shortness of breath. Past treatments include topical steroids, antihistamine and anti-itch cream. The treatment provided no relief.      Problems:  Patient Active Problem List   Diagnosis Date Noted   Prediabetes 03/31/2020   Hypertension 12/02/2017   Obesity (BMI 30-39.9) 09/30/2014   INSOMNIA, CHRONIC 03/18/2010   DISTURBANCE OF SKIN SENSATION 06/16/2009   Hyperlipidemia 04/02/2009   Chronic depression 04/02/2009    Allergies:  Allergies  Allergen Reactions   Codeine Sulfate     REACTION: anxiety   Medications:  Current Outpatient Medications:    acetaminophen (TYLENOL) 500 MG tablet, Take 1 tablet (500 mg total) by mouth every 6 (six) hours as needed., Disp: 30 tablet, Rfl: 0   buPROPion (WELLBUTRIN XL) 300 MG 24 hr tablet, TAKE 1 TABLET BY MOUTH EVERY DAY, Disp:  90 tablet, Rfl: 0   cetirizine (ZYRTEC) 10 MG tablet, Take 10 mg by mouth daily.  , Disp: , Rfl:    dicyclomine (BENTYL) 20 MG tablet, Take 1 tablet (20 mg total) by mouth 2 (two) times daily., Disp: 20 tablet, Rfl: 0   FLUoxetine (PROZAC) 40 MG capsule, Take 1 capsule (40 mg total) by mouth daily., Disp: 90 capsule, Rfl: 3   Fluticasone Propionate (FLONASE NA), Place into the nose. PRN, Disp: , Rfl:     levonorgestrel (MIRENA) 20 MCG/DAY IUD, 1 each by Intrauterine route once., Disp: , Rfl:    losartan (COZAAR) 50 MG tablet, TAKE 1 TABLET BY MOUTH EVERY DAY, Disp: 90 tablet, Rfl: 0   Multiple Vitamin (MULTIVITAMIN) tablet, Take 1 tablet by mouth daily.  , Disp: , Rfl:    NON FORMULARY, CBD as needed, Disp: , Rfl:    NON FORMULARY, Smooth daily- PRN, Disp: , Rfl:    ondansetron (ZOFRAN-ODT) 8 MG disintegrating tablet, Take 1 tablet (8 mg total) by mouth every 8 (eight) hours as needed for nausea., Disp: 20 tablet, Rfl: 0   OVER THE COUNTER MEDICATION, Vitamin B Complex, one tablet daily., Disp: , Rfl:    OVER THE COUNTER MEDICATION, Magnesium , one tablet two times a day., Disp: , Rfl:    Perfluorohexyloctane (MIEBO) 1.338 GM/ML SOLN, Apply 1 drop to eye 4 (four) times daily as needed., Disp: , Rfl:    Polyethyl Glycol-Propyl Glycol (SYSTANE OP), Apply to eye. daily (Patient not taking: Reported on 09/21/2022), Disp: , Rfl:    predniSONE (DELTASONE) 10 MG tablet, Days 1-4 take 4 tablets (40 mg) daily  Days 5-8 take 3 tablets (30 mg) daily, Days 9-11 take 2 tablets (20 mg) daily, Days 12-14 take 1 tablet (10 mg) daily., Disp: 37 tablet, Rfl: 0   Probiotic Product (PROBIOTIC PO), Take by mouth daily., Disp: , Rfl:    triamcinolone cream (KENALOG) 0.1 %, Apply 1 Application topically 2 (two) times daily., Disp: 30 g, Rfl: 0   VITAMIN D PO, Take by mouth daily., Disp: , Rfl:    Wheat Dextrin (BENEFIBER PO), Take by mouth. With fiber daily (Patient not taking: Reported on 09/21/2022), Disp: , Rfl:   Observations/Objective: Patient is well-developed, well-nourished in no acute distress.  Resting comfortably Head is normocephalic, atraumatic.  No labored breathing.  Speech is clear and coherent with logical content.  Patient is alert and oriented at baseline.    Assessment and Plan: 1. Dermatitis due to plants, including poison ivy, sumac, and oak - predniSONE (DELTASONE) 10 MG tablet; Days 1-4  take 4 tablets (40 mg) daily  Days 5-8 take 3 tablets (30 mg) daily, Days 9-11 take 2 tablets (20 mg) daily, Days 12-14 take 1 tablet (10 mg) daily.  Dispense: 37 tablet; Refill: 0 - triamcinolone cream (KENALOG) 0.1 %; Apply 1 Application topically 2 (two) times daily.  Dispense: 30 g; Refill: 0  - Plant exposure (poison ivy/poison oak/poison sumac) with rash - Will prescribe prednisone and triamcinolone. - May use topical Hydrocortisone cream, benadryl cream, and/or calamine lotion for itching - Cool compresses - Luke warm to cool showers - Seek in person evaluation if rash continues to spread or if any appear to become infected   Follow Up Instructions: I discussed the assessment and treatment plan with the patient. The patient was provided an opportunity to ask questions and all were answered. The patient agreed with the plan and demonstrated an understanding of the instructions.  A copy  of instructions were sent to the patient via MyChart unless otherwise noted below.    The patient was advised to call back or seek an in-person evaluation if the symptoms worsen or if the condition fails to improve as anticipated.  Time:  I spent 10 minutes with the patient via telehealth technology discussing the above problems/concerns.    Mar Daring, PA-C

## 2023-04-14 NOTE — Patient Instructions (Signed)
Berkley Harvey, thank you for joining Margaretann Loveless, PA-C for today's virtual visit.  While this provider is not your primary care provider (PCP), if your PCP is located in our provider database this encounter information will be shared with them immediately following your visit.   A [facility] MyChart account gives you access to today's visit and all your visits, tests, and labs performed at [facility] " click here if you don't have a [facility] MyChart account or go to [email or web address]  Consent: (Patient) DAYSI CAYCE provided verbal consent for this virtual visit at the beginning of the encounter.  Current Medications:  Current Outpatient Medications:    acetaminophen (TYLENOL) 500 MG tablet, Take 1 tablet (500 mg total) by mouth every 6 (six) hours as needed., Disp: 30 tablet, Rfl: 0   buPROPion (WELLBUTRIN XL) 300 MG 24 hr tablet, TAKE 1 TABLET BY MOUTH EVERY DAY, Disp: 90 tablet, Rfl: 0   cetirizine (ZYRTEC) 10 MG tablet, Take 10 mg by mouth daily.  , Disp: , Rfl:    dicyclomine (BENTYL) 20 MG tablet, Take 1 tablet (20 mg total) by mouth 2 (two) times daily., Disp: 20 tablet, Rfl: 0   FLUoxetine (PROZAC) 40 MG capsule, Take 1 capsule (40 mg total) by mouth daily., Disp: 90 capsule, Rfl: 3   Fluticasone Propionate (FLONASE NA), Place into the nose. PRN, Disp: , Rfl:    levonorgestrel (MIRENA) 20 MCG/DAY IUD, 1 each by Intrauterine route once., Disp: , Rfl:    losartan (COZAAR) 50 MG tablet, TAKE 1 TABLET BY MOUTH EVERY DAY, Disp: 90 tablet, Rfl: 0   Multiple Vitamin (MULTIVITAMIN) tablet, Take 1 tablet by mouth daily.  , Disp: , Rfl:    NON FORMULARY, CBD as needed, Disp: , Rfl:    NON FORMULARY, Smooth daily- PRN, Disp: , Rfl:    ondansetron (ZOFRAN-ODT) 8 MG disintegrating tablet, Take 1 tablet (8 mg total) by mouth every 8 (eight) hours as needed for nausea., Disp: 20 tablet, Rfl: 0   OVER THE COUNTER MEDICATION, Vitamin B Complex, one tablet daily.,  Disp: , Rfl:    OVER THE COUNTER MEDICATION, Magnesium , one tablet two times a day., Disp: , Rfl:    Perfluorohexyloctane (MIEBO) 1.338 GM/ML SOLN, Apply 1 drop to eye 4 (four) times daily as needed., Disp: , Rfl:    Polyethyl Glycol-Propyl Glycol (SYSTANE OP), Apply to eye. daily (Patient not taking: Reported on 09/21/2022), Disp: , Rfl:    predniSONE (DELTASONE) 10 MG tablet, Days 1-4 take 4 tablets (40 mg) daily  Days 5-8 take 3 tablets (30 mg) daily, Days 9-11 take 2 tablets (20 mg) daily, Days 12-14 take 1 tablet (10 mg) daily., Disp: 37 tablet, Rfl: 0   Probiotic Product (PROBIOTIC PO), Take by mouth daily., Disp: , Rfl:    triamcinolone cream (KENALOG) 0.1 %, Apply 1 Application topically 2 (two) times daily., Disp: 30 g, Rfl: 0   VITAMIN D PO, Take by mouth daily., Disp: , Rfl:    Wheat Dextrin (BENEFIBER PO), Take by mouth. With fiber daily (Patient not taking: Reported on 09/21/2022), Disp: , Rfl:    Medications ordered in this encounter:  Meds ordered this encounter  Medications   predniSONE (DELTASONE) 10 MG tablet    Sig: Days 1-4 take 4 tablets (40 mg) daily  Days 5-8 take 3 tablets (30 mg) daily, Days 9-11 take 2 tablets (20 mg) daily, Days 12-14 take 1 tablet (10 mg) daily.    Dispense:  37 tablet    Refill:  0    Order Specific Question:   Supervising Provider    Answer:   Merrilee Jansky [id number]   triamcinolone cream (KENALOG) 0.1 %    Sig: Apply 1 Application topically 2 (two) times daily.    Dispense:  30 g    Refill:  0    Order Specific Question:   Supervising Provider    Answer:   Merrilee Jansky [id number]     *If you need refills on other medications prior to your next appointment, please contact your pharmacy*  Follow-Up: Call back or seek an in-person evaluation if the symptoms worsen or if the condition fails to improve as anticipated.  [facility] [phone]  Other Instructions Poison Oak Dermatitis  Poison oak dermatitis is  inflammation of the skin that is caused by contact with the chemicals in the leaves of the poison oak (Toxicodendron) plant. The skin reaction often includes redness, swelling, blisters, and extreme itching. What are the causes? This condition is caused by a specific chemical (urushiol) that is found in the sap of the poison oak plant. This chemical is sticky and can be easily spread to people, animals, and objects. You can get poison oak dermatitis by: Having direct contact with a poison oak plant. Touching animals, other people, or objects that have come in contact with poison oak and have the chemical on them. What increases the risk? This condition is more likely to develop in people who: Are outdoors often in wooded or [place] areas. Go outdoors without wearing protective clothing, such as closed shoes, long pants, and a long-sleeved shirt. What are the signs or symptoms? Symptoms of this condition include: Redness of the skin. Extreme itching. A rash that often includes bumps and blisters. The rash usually appears 48 hours after exposure if you have been exposed before. If this is the first time you have been exposed, the rash may not appear until a week after exposure. Swelling. This may occur if the reaction is more severe. Symptoms usually last for 1-2 weeks. However, the first time you develop this condition, symptoms may last 3-4 weeks. How is this diagnosed? This condition may be diagnosed based on your symptoms and a physical exam. Your health care provider may also ask you about any recent outdoor activity. How is this treated? Treatment for this condition will vary depending on how severe it is. Treatment may include: Hydrocortisone creams or calamine lotions to relieve itching. Oatmeal baths to soothe the skin. Over-the-counter antihistamine medicines to help reduce itching. Steroid medicine taken by mouth (orally) for more severe reactions. Follow these instructions at  home: Medicines Take or apply over-the-counter and prescription medicines only as told by your health care provider. Use hydrocortisone creams or calamine lotion as needed to soothe the skin and relieve itching. General instructions Do not scratch or rub your skin. Apply a cold, wet cloth (cold compress) to the affected areas or take baths in cool water. This will help with itching. Avoid hot baths and showers. Take oatmeal baths as needed. Use colloidal oatmeal. You can get this at your local pharmacy or grocery store. Follow the instructions on the packaging. Wash clothes, bedsheets, towels, and blankets that you wore or came in contact with between your exposure to the plant and the appearance of your rash. The oils can remain on these items and continue to cause new exposure. Check the affected area every day for signs of infection. Check  for: More redness, swelling, or pain. Fluid or blood. Warmth. Pus or a bad smell. Keep all follow-up visits. Your health care provider may want to see how your skin is progressing with treatment. How is this prevented?  Learn to identify the poison oak plant and avoid contact with the plant. This plant can be recognized by the number of leaves. Generally, poison oak has three leaves with flowering branches on a single stem. The leaves are often a bit fuzzy and have a toothlike edge. If you have been exposed to poison oak, thoroughly wash your skin with soap and water right away. You have about 30 minutes to remove the plant resin before it will cause the rash. Be sure to wash under your fingernails because any plant resin there will continue to spread the rash. When hiking or camping, wear clothes that will help you avoid exposure on the skin. This includes long pants, a long-sleeved shirt, long socks, and hiking boots. You can also apply preventive lotion to your skin to help limit exposure. If you suspect that your clothes or outdoor gear came in contact  with poison oak, rinse them off outside with a garden hose before bringing them inside your house. When doing yard work or gardening, wear gloves, long sleeves, long pants, and boots. Wash your garden tools and gloves if they come in contact with poison oak. If you suspect that your pet has come into contact with poison oak, wash them with pet shampoo and water. Make sure you wear gloves while washing your pet. Do not burn poison oak plants. This can release the chemical from the plant into the air and may cause a reaction on the skin or eyes, or in the lungs from breathing in the smoke. Contact a health care provider if: You have open sores in the rash area. You have any signs of infection. You have redness that spreads beyond the rash area. You have a fever. You have a rash over a large area of your body. You have a rash on your eyes, mouth, or genitals. You have a rash that does not improve after a few weeks. Get help right away if: Your face swells or your eyes swell shut. You have trouble breathing. You have trouble swallowing. These symptoms may be an emergency. Get help right away. Call 911. Do not wait to see if the symptoms will go away. Do not drive yourself to the hospital. This information is not intended to replace advice given to you by your health care provider. Make sure you discuss any questions you have with your health care provider. Document Revised: 04/21/2022 Document Reviewed: 03/11/2022 Elsevier Patient Education  2024 Elsevier Inc.    If you have been instructed to have an in-person evaluation today at a local Urgent Care facility, please use the link below. It will take you to a list of all of our available [facility], including address, phone number and hours of operation. Please do not delay care.  [facility]  If you or a family member do not have a primary care provider, use the link below to schedule a visit and establish care.  When you choose a [facility] primary care physician or advanced practice provider, you gain a long-term partner in health. Find a Primary Care Provider  Learn more about [facility]'s in-office and virtual care options: [facility] - Get Care Now

## 2023-05-08 ENCOUNTER — Other Ambulatory Visit: Payer: Self-pay | Admitting: Family Medicine

## 2023-06-05 ENCOUNTER — Other Ambulatory Visit: Payer: Self-pay | Admitting: Family Medicine

## 2023-06-22 ENCOUNTER — Other Ambulatory Visit: Payer: Self-pay | Admitting: Family Medicine

## 2023-07-01 ENCOUNTER — Other Ambulatory Visit: Payer: Self-pay

## 2023-07-01 MED ORDER — FLUOXETINE HCL 40 MG PO CAPS: 40.0000 mg | ORAL_CAPSULE | Freq: Every day | ORAL | 0 refills | Status: DC

## 2023-07-08 ENCOUNTER — Other Ambulatory Visit: Payer: Self-pay | Admitting: Family Medicine

## 2023-07-21 ENCOUNTER — Other Ambulatory Visit: Payer: Self-pay | Admitting: Family Medicine

## 2023-08-18 ENCOUNTER — Other Ambulatory Visit: Payer: Self-pay | Admitting: Family Medicine

## 2023-08-18 ENCOUNTER — Telehealth: Payer: Self-pay | Admitting: Family Medicine

## 2023-08-18 MED ORDER — FLUOXETINE HCL 40 MG PO CAPS: 40.0000 mg | ORAL_CAPSULE | Freq: Every day | ORAL | 0 refills | Status: DC

## 2023-08-18 NOTE — Telephone Encounter (Signed)
Rx sent 

## 2023-08-18 NOTE — Telephone Encounter (Signed)
I spoke with the patient's pharmacy and they reported insurance will not cover 30 day supply. Pharmacist requested 90 day supply or mail order pharmacy. Patient is scheduled to see PCP on 08/22/23 and rx was sent for 90 days until patient is seen.

## 2023-08-18 NOTE — Addendum Note (Signed)
Addended by: Laneta Simmers L on: 08/18/2023 03:00 PM   Modules accepted: Orders

## 2023-08-18 NOTE — Telephone Encounter (Signed)
Requesting enough FLUoxetine (PROZAC) 40 MG capsule to get her to her next appt

## 2023-08-22 ENCOUNTER — Encounter: Payer: Self-pay | Admitting: Family Medicine

## 2023-08-22 ENCOUNTER — Ambulatory Visit (INDEPENDENT_AMBULATORY_CARE_PROVIDER_SITE_OTHER): Payer: Managed Care, Other (non HMO) | Admitting: Family Medicine

## 2023-08-22 VITALS — BP 114/70 | HR 82 | Temp 98.2°F | Ht 65.5 in | Wt 257.9 lb

## 2023-08-22 DIAGNOSIS — E785 Hyperlipidemia, unspecified: Secondary | ICD-10-CM

## 2023-08-22 DIAGNOSIS — I10 Essential (primary) hypertension: Secondary | ICD-10-CM

## 2023-08-22 DIAGNOSIS — F32A Depression, unspecified: Secondary | ICD-10-CM

## 2023-08-22 DIAGNOSIS — F419 Anxiety disorder, unspecified: Secondary | ICD-10-CM | POA: Insufficient documentation

## 2023-08-22 MED ORDER — FLUOXETINE HCL 40 MG PO CAPS: 40.0000 mg | ORAL_CAPSULE | Freq: Every day | ORAL | 3 refills | Status: AC

## 2023-08-22 NOTE — Progress Notes (Signed)
Established Patient Office Visit  Subjective   Patient ID: Gina Vargas, female    DOB: 13-Oct-1971  Age: 52 y.o. MRN: [id number]  Chief Complaint  Patient presents with   Medication Consultation    HPI   [facility] seen for medical follow-up.  She has history of obesity, recurrent depression, hypertension, hyperlipidemia, chronic anxiety.  She is followed by GYN and recently went on estrogen patch.  She is postmenopausal.  She takes losartan 50 mg daily for hypertension.  Said a couple of recent mildly elevated blood pressure readings but for most part controlled.  She takes fluoxetine for her chronic anxiety 40 mg daily.  Still has occasional breakthrough symptoms.  She started a new job couple weeks ago and has had a lot of job stress and symptoms typical of prior panic attacks recently.  She has mild hyperlipidemia.  Her mom had CABG age 73.  Gina Vargas's had some nonexertional atypical chest symptoms recently but she thinks this is more stress related.  Past Medical History:  Diagnosis Date   Allergy    Anxiety    Arthritis    Depression    Hyperlipidemia    Hypertension    Kidney stone    Past Surgical History:  Procedure Laterality Date   APPENDECTOMY     TONSILLECTOMY     WISDOM TOOTH EXTRACTION      reports that she has quit smoking. Her smoking use included cigarettes. She has never used smokeless tobacco. She reports current alcohol use. She reports that she does not use drugs. family history includes Arthritis in an other family member; Cancer in an other family member; Colon polyps in her mother; Coronary artery disease in her maternal grandfather; Diabetes in her father; Heart disease in an other family member; Hyperlipidemia in an other family member; Hypertension in an other family member. Allergies  Allergen Reactions   Codeine Sulfate     REACTION: anxiety    Review of Systems  Eyes:  Negative for blurred vision.  Respiratory:  Negative for shortness of  breath.   Cardiovascular:  Negative for chest pain.  Neurological:  Negative for dizziness, weakness and headaches.      Objective:     BP 114/70 (BP Location: Left Arm, Patient Position: Sitting, Cuff Size: Normal)   Pulse 82   Temp 98.2 F (36.8 C) (Oral)   Ht 5' 5.5" (1.664 m)   Wt 257 lb 14.4 oz (117 kg)   SpO2 98%   BMI 42.26 kg/m  BP Readings from Last 3 Encounters:  08/22/23 114/70  09/16/22 117/63  05/03/22 136/82   Wt Readings from Last 3 Encounters:  08/22/23 257 lb 14.4 oz (117 kg)  05/03/22 262 lb 1.6 oz (118.9 kg)  04/05/22 265 lb (120.2 kg)      Physical Exam Vitals reviewed.  Constitutional:      Appearance: She is well-developed.  Eyes:     Pupils: Pupils are equal, round, and reactive to light.  Neck:     Thyroid: No thyromegaly.     Vascular: No JVD.  Cardiovascular:     Rate and Rhythm: Normal rate and regular rhythm.     Heart sounds:     No gallop.  Pulmonary:     Effort: Pulmonary effort is normal. No respiratory distress.     Breath sounds: Normal breath sounds. No wheezing or rales.  Musculoskeletal:     Cervical back: Neck supple.  Neurological:     Mental Status: She is alert.  No results found for any visits on 08/22/23.  Last CBC Lab Results  Component Value Date   WBC 16.8 (H) 09/16/2022   HGB 14.1 09/16/2022   HCT 43.2 09/16/2022   MCV 89.4 09/16/2022   MCH 29.2 09/16/2022   RDW 13.2 09/16/2022   PLT 380 09/16/2022   Last metabolic panel Lab Results  Component Value Date   GLUCOSE 120 (H) 09/16/2022   NA 137 09/16/2022   K 4.5 09/16/2022   CL 100 09/16/2022   CO2 24 09/16/2022   BUN 18 09/16/2022   CREATININE 0.85 09/16/2022   GFRNONAA >60 09/16/2022   CALCIUM 10.1 09/16/2022   PROT 7.4 09/16/2022   ALBUMIN 4.5 09/16/2022   BILITOT 0.7 09/16/2022   ALKPHOS 85 09/16/2022   AST 14 (L) 09/16/2022   ALT 20 09/16/2022   ANIONGAP 13 09/16/2022   Last lipids Lab Results  Component Value Date   CHOL  228 (H) 10/28/2017   HDL 56 10/28/2017   LDLCALC 150 (H) 10/28/2017   TRIG 108 10/28/2017   CHOLHDL 4.1 10/28/2017   Last hemoglobin A1c Lab Results  Component Value Date   HGBA1C 5.8 (H) 10/28/2017   Last thyroid functions Lab Results  Component Value Date   TSH 2.98 10/28/2017      The ASCVD Risk score (Arnett DK, et al., 2019) failed to calculate for the following reasons:   Cannot find a previous HDL lab   Cannot find a previous total cholesterol lab    Assessment & Plan:   #1 hypertension well-controlled by today's reading.  Repeat reading left arm seated after rest 116/78.  Continue losartan 50 mg daily.  Continue weight loss efforts.  #2 history of chronic anxiety with history of panic attacks.  On Prozac 40 mg daily.  Still has some breakthrough anxiety symptoms but overall has done fairly well with Prozac.  Requesting refills.  Refilled for 1 year  #3 history of mild hyperlipidemia.  Previous LDL cholesterol around 130.  Positive family history of CAD in her mother at age 97.  We did discuss possible coronary calcium score for further risk stratification and she will consider.  Handout given.   No follow-ups on file.    Evelena Peat, MD

## 2023-11-16 ENCOUNTER — Other Ambulatory Visit: Payer: Self-pay | Admitting: Family Medicine

## 2024-02-11 ENCOUNTER — Other Ambulatory Visit: Payer: Self-pay | Admitting: Family Medicine

## 2024-05-07 ENCOUNTER — Other Ambulatory Visit: Payer: Self-pay

## 2024-05-07 ENCOUNTER — Emergency Department (HOSPITAL_BASED_OUTPATIENT_CLINIC_OR_DEPARTMENT_OTHER)

## 2024-05-07 ENCOUNTER — Encounter (HOSPITAL_BASED_OUTPATIENT_CLINIC_OR_DEPARTMENT_OTHER): Payer: Self-pay

## 2024-05-07 ENCOUNTER — Emergency Department (HOSPITAL_BASED_OUTPATIENT_CLINIC_OR_DEPARTMENT_OTHER)
Admission: EM | Admit: 2024-05-07 | Discharge: 2024-05-07 | Disposition: A | Discharge status: Home / Self Care | Attending: Emergency Medicine | Admitting: Emergency Medicine

## 2024-05-07 DIAGNOSIS — N201 Calculus of ureter: Secondary | ICD-10-CM

## 2024-05-07 DIAGNOSIS — Z87891 Personal history of nicotine dependence: Secondary | ICD-10-CM | POA: Insufficient documentation

## 2024-05-07 DIAGNOSIS — I10 Essential (primary) hypertension: Secondary | ICD-10-CM | POA: Insufficient documentation

## 2024-05-07 DIAGNOSIS — R109 Unspecified abdominal pain: Secondary | ICD-10-CM

## 2024-05-07 DIAGNOSIS — R112 Nausea with vomiting, unspecified: Secondary | ICD-10-CM

## 2024-05-07 DIAGNOSIS — N132 Hydronephrosis with renal and ureteral calculous obstruction: Secondary | ICD-10-CM | POA: Insufficient documentation

## 2024-05-07 DIAGNOSIS — Z79899 Other long term (current) drug therapy: Secondary | ICD-10-CM | POA: Diagnosis not present

## 2024-05-07 LAB — COMPREHENSIVE METABOLIC PANEL WITH GFR
ALT: 15 U/L (ref 0–44)
AST: 19 U/L (ref 15–41)
Albumin: 4.3 g/dL (ref 3.5–5.0)
Alkaline Phosphatase: 95 U/L (ref 38–126)
Anion gap: 17 — ABNORMAL HIGH (ref 5–15)
BUN: 19 mg/dL (ref 6–20)
CO2: 17 mmol/L — ABNORMAL LOW (ref 22–32)
Calcium: 10.4 mg/dL — ABNORMAL HIGH (ref 8.9–10.3)
Chloride: 102 mmol/L (ref 98–111)
Creatinine, Ser: 1.02 mg/dL — ABNORMAL HIGH (ref 0.44–1.00)
GFR, Estimated: >60 mL/min (ref >60)
Glucose, Bld: 143 mg/dL — ABNORMAL HIGH (ref 70–99)
Potassium: 4.3 mmol/L (ref 3.5–5.1)
Sodium: 135 mmol/L (ref 135–145)
Total Bilirubin: 0.5 mg/dL (ref 0.0–1.2)
Total Protein: 7.2 g/dL (ref 6.5–8.1)

## 2024-05-07 LAB — URINALYSIS, ROUTINE W REFLEX MICROSCOPIC
Bacteria, UA: NONE SEEN
Bilirubin Urine: NEGATIVE
Glucose, UA: NEGATIVE mg/dL
Ketones, ur: 15 mg/dL — AB
Leukocytes,Ua: NEGATIVE
Nitrite: NEGATIVE
RBC / HPF: >50 RBC/hpf (ref 0–5)
Specific Gravity, Urine: >1.046 — ABNORMAL HIGH (ref 1.005–1.030)
pH: 8.5 — ABNORMAL HIGH (ref 5.0–8.0)

## 2024-05-07 LAB — CBC
HCT: 44.8 % (ref 36.0–46.0)
Hemoglobin: 14.9 g/dL (ref 12.0–15.0)
MCH: 29.2 pg (ref 26.0–34.0)
MCHC: 33.3 g/dL (ref 30.0–36.0)
MCV: 87.7 fL (ref 80.0–100.0)
Platelets: 287 K/uL (ref 150–400)
RBC: 5.11 MIL/uL (ref 3.87–5.11)
RDW: 13.7 % (ref 11.5–15.5)
WBC: 9.4 K/uL (ref 4.0–10.5)
nRBC: 0 % (ref 0.0–0.2)

## 2024-05-07 LAB — LIPASE, BLOOD: Lipase: 25 U/L (ref 11–51)

## 2024-05-07 MED ORDER — SODIUM CHLORIDE 0.9 % IV BOLUS
1000.0000 mL | Freq: Once | INTRAVENOUS | Status: AC
  Administered 2024-05-07: 1000 mL via INTRAVENOUS

## 2024-05-07 MED ORDER — KETOROLAC TROMETHAMINE 15 MG/ML IJ SOLN
15.0000 mg | Freq: Once | INTRAMUSCULAR | Status: AC
  Administered 2024-05-07: 15 mg via INTRAVENOUS
  Filled 2024-05-07: qty 1

## 2024-05-07 MED ORDER — PROMETHAZINE HCL 25 MG RE SUPP: 25.0000 mg | Freq: Four times a day (QID) | RECTAL | 0 refills | Status: AC | PRN

## 2024-05-07 MED ORDER — TAMSULOSIN HCL 0.4 MG PO CAPS: 0.4000 mg | ORAL_CAPSULE | Freq: Every day | ORAL | 0 refills | Status: AC

## 2024-05-07 MED ORDER — PROMETHAZINE HCL 25 MG/ML IJ SOLN
INTRAMUSCULAR | Status: AC
  Filled 2024-05-07: qty 1

## 2024-05-07 MED ORDER — ONDANSETRON 4 MG PO TBDP
4.0000 mg | ORAL_TABLET | Freq: Three times a day (TID) | ORAL | 0 refills | Status: AC | PRN
Start: 2024-05-07 — End: ?

## 2024-05-07 MED ORDER — IOHEXOL 350 MG/ML SOLN
100.0000 mL | Freq: Once | INTRAVENOUS | Status: AC | PRN
  Administered 2024-05-07: 85 mL via INTRAVENOUS

## 2024-05-07 MED ORDER — SODIUM CHLORIDE 0.9 % IV BOLUS
500.0000 mL | Freq: Once | INTRAVENOUS | Status: AC
  Administered 2024-05-07: 500 mL via INTRAVENOUS

## 2024-05-07 MED ORDER — OXYCODONE HCL 5 MG PO TABS: 5.0000 mg | ORAL_TABLET | ORAL | 0 refills | Status: AC | PRN

## 2024-05-07 MED ORDER — SODIUM CHLORIDE 0.9 % IV SOLN
12.5000 mg | Freq: Once | INTRAVENOUS | Status: AC
  Administered 2024-05-07: 12.5 mg via INTRAVENOUS
  Filled 2024-05-07: qty 0.5

## 2024-05-07 MED ORDER — TAMSULOSIN HCL 0.4 MG PO CAPS
0.4000 mg | ORAL_CAPSULE | Freq: Once | ORAL | Status: AC
  Administered 2024-05-07: 0.4 mg via ORAL
  Filled 2024-05-07: qty 1

## 2024-05-07 MED ORDER — MORPHINE SULFATE (PF) 4 MG/ML IV SOLN
4.0000 mg | Freq: Once | INTRAVENOUS | Status: AC
  Administered 2024-05-07: 4 mg via INTRAVENOUS
  Filled 2024-05-07: qty 1

## 2024-05-07 MED ORDER — ONDANSETRON HCL 4 MG/2ML IJ SOLN
4.0000 mg | Freq: Once | INTRAMUSCULAR | Status: AC
  Administered 2024-05-07: 4 mg via INTRAVENOUS
  Filled 2024-05-07: qty 2

## 2024-05-07 MED ORDER — ONDANSETRON 4 MG PO TBDP
ORAL_TABLET | ORAL | Status: AC
  Filled 2024-05-07: qty 1

## 2024-05-07 MED ORDER — DROPERIDOL 2.5 MG/ML IJ SOLN
1.2500 mg | Freq: Once | INTRAMUSCULAR | Status: AC
  Administered 2024-05-07: 1.25 mg via INTRAVENOUS
  Filled 2024-05-07: qty 2

## 2024-05-07 MED ORDER — KETOROLAC TROMETHAMINE 10 MG PO TABS: 10.0000 mg | ORAL_TABLET | Freq: Four times a day (QID) | ORAL | 0 refills | Status: AC | PRN

## 2024-05-07 NOTE — ED Notes (Signed)
 Pt given discharge instructions and reviewed prescriptions. Opportunities given for questions. Pt verbalizes understanding. PIV removed x1. Jillyn Hidden, RN

## 2024-05-07 NOTE — Discharge Instructions (Addendum)
 As discussed, you have a 9 mm kidney stone causing some obstruction on the left side which is most likely causing your symptoms.  Will send you with 2 different medicines for pain.  One being Toradol  which is a medicine we gave you near the end of your emergency department visit which is similar to an ibuprofen  or Aleve.  Also sent you home with oxycodone  for breakthrough pain.  Will send you home with nausea medicine as well as well as medicine called Flomax  to help dilate the ureter and aid in passage of the stone.  If your symptoms return or not controlled by the medicine sent into the pharmacy, please return to [facility] emergency department as this is where our urology specialist are located.  Otherwise, recommend follow-up with urology in the outpatient setting as we discussed as they are expecting you.

## 2024-05-07 NOTE — ED Provider Notes (Signed)
 [place] EMERGENCY DEPARTMENT AT [facility] Provider Note   CSN: [id number] Arrival date & time: 05/07/24  1306     Patient presents with: Abdominal Pain and Emesis   Gina Vargas is a 53 y.o. female.    Abdominal Pain Associated symptoms: vomiting   Emesis Associated symptoms: abdominal pain     53 year old female presents the ED with complaints of abdominal pain, nausea, vomiting.  Patient reports symptoms beginning about an hour prior to arrival.  Describes pain as diffuse left-sided in nature without radiation.  Denies any fevers, chills, urinary symptoms, change in bowel habits.  States she has history of similar symptoms November 2023 when she was diagnosed with colitis and states the symptoms feel similar but are much worse.  Last bowel movement this morning regular per patient.  Denies any hematemesis.  Patient does state that she is on Mounjaro with most recent dose increased 2 weeks ago  Past medical history significant for hyperlipidemia, hypertension, prediabetes.  Prior to Admission medications   Medication Sig Start Date End Date Taking? Authorizing Provider  acetaminophen  (TYLENOL ) 500 MG tablet Take 1 tablet (500 mg total) by mouth every 6 (six) hours as needed. 09/16/22   Nanavati, Ankit, MD  buPROPion  (WELLBUTRIN  XL) 300 MG 24 hr tablet TAKE 1 TABLET BY MOUTH EVERY DAY 02/13/24   Burchette, Wolm ORN, MD  cetirizine (ZYRTEC) 10 MG tablet Take 10 mg by mouth daily.      [provider]  dicyclomine  (BENTYL ) 20 MG tablet Take 1 tablet (20 mg total) by mouth 2 (two) times daily. 09/16/22   Nanavati, Ankit, MD  estradiol (VIVELLE-DOT) 0.05 MG/24HR patch Place 1 patch onto the skin 2 (two) times a week.    [provider]  FLUoxetine  (PROZAC ) 40 MG capsule Take 1 capsule (40 mg total) by mouth daily. 08/22/23   Burchette, Wolm ORN, MD  Fluticasone Propionate (FLONASE NA) Place into the nose. PRN    [provider]  losartan  (COZAAR ) 50  MG tablet TAKE 1 TABLET BY MOUTH EVERY DAY 02/13/24   Burchette, Wolm ORN, MD  Multiple Vitamin (MULTIVITAMIN) tablet Take 1 tablet by mouth daily.      [provider]  NON FORMULARY CBD as needed    [provider]  NON FORMULARY Smooth daily- PRN    [provider]  OVER THE COUNTER MEDICATION Vitamin B Complex, one tablet daily.    [provider]  OVER THE COUNTER MEDICATION Magnesium , one tablet two times a day.    [provider]  Perfluorohexyloctane (MIEBO) 1.338 GM/ML SOLN Apply 1 drop to eye 4 (four) times daily as needed.    [provider]  Polyethyl Glycol-Propyl Glycol (SYSTANE OP) Apply to eye. daily    [provider]  Probiotic Product (PROBIOTIC PO) Take by mouth daily.    [provider]  progesterone (PROMETRIUM) 100 MG capsule Take 1 tablet by mouth daily.    [provider]  triamcinolone  cream (KENALOG ) 0.1 % Apply 1 Application topically 2 (two) times daily. 04/14/23   Vivienne Delon HERO, PA-C  VITAMIN D PO Take by mouth daily.    [provider]  Wheat Dextrin (BENEFIBER PO) Take by mouth. With fiber daily    [provider]    Allergies: Codeine sulfate    Review of Systems  Gastrointestinal:  Positive for abdominal pain and vomiting.  All other systems reviewed and are negative.   Updated Vital Signs BP ROLLEN)  143/111 (BP Location: Left Arm)   Pulse 75   Temp 97.9 F (36.6 C)   Resp (!) 24   SpO2 99%   Physical Exam Vitals and nursing note reviewed.  Constitutional:      General: She is not in acute distress.    Appearance: She is well-developed.  HENT:     Head: Normocephalic and atraumatic.  Eyes:     Conjunctiva/sclera: Conjunctivae normal.  Cardiovascular:     Rate and Rhythm: Normal rate and regular rhythm.     Heart sounds: No murmur heard. Pulmonary:     Effort: Pulmonary effort is normal. No respiratory distress.     Breath sounds: Normal  breath sounds.  Abdominal:     Palpations: Abdomen is soft.     Tenderness: There is no guarding or rebound.     Comments: No real reproducible abdominal tenderness although patient eliciting left-sided abdominal pain.  Slightly left lower quadrant tenderness.  Musculoskeletal:        General: No swelling.     Cervical back: Neck supple.  Skin:    General: Skin is warm and dry.     Capillary Refill: Capillary refill takes less than 2 seconds.  Neurological:     Mental Status: She is alert.  Psychiatric:        Mood and Affect: Mood normal.     (all labs ordered are listed, but only abnormal results are displayed) Labs Reviewed  LIPASE, BLOOD  COMPREHENSIVE METABOLIC PANEL WITH GFR  CBC  URINALYSIS, ROUTINE W REFLEX MICROSCOPIC    EKG: None  Radiology: No results found.   Procedures   Medications Ordered in the ED  ondansetron  (ZOFRAN -ODT) 4 MG disintegrating tablet (has no administration in time range)                                    Medical Decision Making Amount and/or Complexity of Data Reviewed Labs: ordered. Radiology: ordered.  Risk Prescription drug management.   This patient presents to the ED for concern of abdominal pain, this involves an extensive number of treatment options, and is a complaint that carries with it a high risk of complications and morbidity.  The differential diagnosis includes gastritis, PUD, cholecystitis, CBD pathology, SBO/LBO, diverticulitis, appendicitis, cystitis, pyelonephritis, nephrolithiasis, mesenteric ischemia, other   Co morbidities that complicate the patient evaluation  See HPI   Additional history obtained:  Additional history obtained from EMR External records from outside source obtained and reviewed including hospital records   Lab Tests:  I Ordered, and personally interpreted labs.  The pertinent results include: No leukocytosis.  No evidence of anemia.  Platelets within range.  Lipase within normal  limits.  Creatinine slightly elevated at 1.02 from baseline around 0.8.  Decrease in bicarb of 17 otherwise, electrolytes within normal limits besides slight hypercalcemia of 10.4.  No transaminitis.  UA 15 ketones, trace proteins, moderate hemoglobin, greater than 50 RBCs.   Imaging Studies ordered:  I ordered imaging studies including CT abdomen pelvis I independently visualized and interpreted imaging which showed patent visceral arteries without evidence of stenosis or occlusion.  9 mm stone possible left ureter made inflammatory changes surrounding the proximal left renal collecting system with mild left hydronephrosis. I agree with the radiologist interpretation  Cardiac Monitoring: / EKG:  The patient was maintained on a cardiac monitor.  I personally viewed and interpreted the cardiac monitored which showed an underlying  rhythm of: Sinus rhythm   Consultations Obtained:  N/a   Problem List / ED Course / Critical interventions / Medication management  Ureterolithiasis, abdominal pain, nausea, vomiting I ordered medication including morphine , Zofran , normal saline   Reevaluation of the patient after these medicines showed that the patient improved I have reviewed the patients home medicines and have made adjustments as needed   Social Determinants of Health:  Former cigarette use.  Denies illicit drug use.   Test / Admission - Considered:  Ureterolithiasis, abdominal pain, nausea, vomiting Vitals signs significant for retention,. Otherwise within normal range and stable throughout visit. Laboratory/imaging studies significant for: See above 53 year old female presents the ED with complaints of abdominal pain, nausea, vomiting.  Patient reports symptoms beginning about an hour prior to arrival.  Describes pain as diffuse left-sided in nature without radiation.  Denies any fevers, chills, urinary symptoms, change in bowel habits.  States she has history of similar symptoms  November 2023 when she was diagnosed with colitis and states the symptoms feel similar but are much worse.  Last bowel movement this morning regular per patient.  Denies any hematemesis.  Patient does state that she is on Mounjaro with most recent dose increased 2 weeks ago On exam, minimal left lower quadrant tenderness.  No obvious CVA tenderness.  Labs concerning for new baseline creatinine about slight elevation 0.8 0.9 at baseline with 1.02 today.  UA without obvious infection but with RBCs, ketones, protein, hemoglobin present.  CT angio imaging was obtained given pain out of portion to exam without obvious CVA tenderness concern for possible mesenteric ischemia which did show evidence of left-sided 9 mm kidney stone causing obstruction as most likely etiology of patient's symptoms.  Treated with medications in the ED and noted significant improvement/near resolution of symptoms.  Consulted urology given size of stone as well as location who recommended outpatient follow-up.  Will recommend symptomatic therapy as described in AVS with strict ED return precautions as described in AVS.  Treatment Discussed with patient and she acknowledged and was agreeable to said plan.  Patient will well-appearing, afebrile in no acute distress.  Worrisome signs and symptoms were discussed with the patient, and the patient acknowledged understanding to return to the ED if noticed. Patient was stable upon discharge.       Final diagnoses:  None    ED Discharge Orders     None          Silver Wonda LABOR, [place] 05/07/24 1758    Franklyn Sid SAILOR, MD 05/08/24 1739

## 2024-05-07 NOTE — ED Triage Notes (Signed)
 Sudden onset of lower abd burning and vomiting 1 hour ago. Patient reports being seen for this prior without being able to find a reason.   Patient in obvious discomfort in triage.

## 2024-05-10 ENCOUNTER — Other Ambulatory Visit: Payer: Self-pay | Admitting: Urology

## 2024-05-14 NOTE — Progress Notes (Signed)
 Left voicermail to arrive at [facility] main entrance at 0645, stop aspirin, all NSAIDS, vitamins, supplements starting Tuesday, do not have anything to eat or drink after Midnight on Thursday, bring driver's license, insurance card and blue folder to hospital, do not bring money, valuables, credit cards, jewelry, do not wear metal from the waist down, and we will try to call back tomorrow.

## 2024-05-16 ENCOUNTER — Other Ambulatory Visit: Payer: Self-pay | Admitting: Family Medicine

## 2024-05-16 NOTE — Progress Notes (Signed)
 Left voicemail to take laxative of choice tomorrow, hydrate well and eat light meal that evening, and nothing to eat or drink after midnight Thursday. May call back to [phone] if receiving this message in the next few minutes, and we will try to call back tomorrow.

## 2024-05-17 NOTE — Progress Notes (Signed)
 During prop phone call, learned she takes Tirzepatide weekly, last dose 05/12/2024. Per [place] stone stone, must be off 7 days prior to procedure. Pt aware and I will notify [facility].

## 2024-05-18 ENCOUNTER — Other Ambulatory Visit: Payer: Self-pay | Admitting: Urology

## 2024-05-18 ENCOUNTER — Encounter (HOSPITAL_COMMUNITY): Payer: Self-pay | Admitting: Urology

## 2024-05-18 NOTE — Progress Notes (Signed)
 Spoke w/ via phone for pre-op interview---[facility] needs dos----KUB               COVID test -----patient states asymptomatic no test needed Arrive at -------0800 NPO after MN NO Solid Food.  Clear liquids from MN until---0400 Med rec completed. Pt aware to hold ASA/NSAIDs and supplements per [facility] protocol.  Medications to take morning of surgery -----losarten, oxycodone , zofran , Diabetic/Weight loss medication ----- No Alcohol or recreational drugs for 24 hours/Tobacco products for 6 hours ---- Patient instructed to bring blue lithotripsy folder, photo id and insurance card day of surgery. Patient aware to have Driver (ride ) / caregiver for 24 hours after surgery -----Augustin SHELBY CLAUDIA RUSHIE Patient Special Instructions -----bring blue folder and wear comfortable clothes without metal belt buckles, buttons, or zippers. NO sandals, flip flops, crocs, or clogs. Pre-Op special Instructions ----- take laxative of choice day before procedure Patient verbalized understanding of instructions that were given at this phone interview. Patient denies shortness of breath, chest pain, fever, cough at this phone interview.

## 2024-05-23 NOTE — H&P (Signed)
 I have pain in the flank.  HPI: Gina Vargas is a 53 year-old female patient who is here for flank pain.  The problem is on the left side. Her pain started about 05/07/2024. The pain is sharp. The intensity of her pain is rated as a 0. The pain is intermittent.   Pain medications< makes the pain better. Nothing causes the pain to become worse. She was treated with the following pain medication(s): Toradol  and Oxycodone .   She has not had this same pain previously. She has not had kidney stones.   The patient was seen in the emergency department on 05/07/2024 due to acute abdominal pain. CT stone study at that time revealed a 9 mm left proximal ureteral stone (visible on scout, ~1000 HU) with mild hydronephrosis. Her serum creatinine at that time was found to be 1.02, WBC was 9.4 and urinalysis was positive for blood with no signs of a UTI.   Today, she reports that her pain is well managed with Percocet and denies nausea/vomiting, fever/chills, dysuria or hematuria.     ALLERGIES: Codeine    MEDICATIONS: buPROPion  HCl ER (XL) 300 MG Tablet Extended Release 24 Hour  Desvenlafaxine ER  Estradiol  FLUoxetine  HCl 20 MG Capsule  Losartan  Potassium 50 MG Tablet  Progesterone 100 MG Capsule     GU PSH: None   NON-GU PSH: Appendectomy     GU PMH: None     PMH Notes: Cardiovascular and Mediastinum  Hypertension   Other  Hyperlipidemia  INSOMNIA, CHRONIC  Chronic depression  DISTURBANCE OF SKIN SENSATION  Obesity (BMI 30-39.9)  Prediabetes  Anxiety      NON-GU PMH: Arthritis Depression Hypercholesterolemia Hypertension    FAMILY HISTORY: Heart Attack - Mother stroke - Runs in Family   SOCIAL HISTORY: Marital Status: Single Preferred Language: English; Ethnicity: Not Hispanic Or Latino; Race: White Current Smoking Status: Patient has never smoked.   Tobacco Use Assessment Completed: Used Tobacco in last 30 days? Drinks 1 drink per week.  Drinks 1 caffeinated drink per  day. 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    REVIEW OF SYSTEMS:    GU Review Female:   Patient denies frequent urination, hard to postpone urination, burning /pain with urination, get up at night to urinate, leakage of urine, stream starts and stops, trouble starting your stream, have to strain to urinate, and being pregnant.  Gastrointestinal (Upper):   Patient reports nausea and vomiting. Patient denies indigestion/ heartburn.  Gastrointestinal (Lower):   Patient denies diarrhea and constipation.  Constitutional:   Patient reports night sweats and weight loss. Patient denies fever and fatigue.  Skin:   Patient denies skin rash/ lesion and itching.  Eyes:   Patient denies blurred vision and double vision.  Ears/ Nose/ Throat:   Patient denies sore throat and sinus problems.  Hematologic/Lymphatic:   Patient denies swollen glands and easy bruising.  Cardiovascular:   Patient denies leg swelling and chest pains.  Respiratory:   Patient denies cough and shortness of breath.  Endocrine:   Patient denies excessive thirst.  Musculoskeletal:   Patient denies back pain and joint pain.  Neurological:   Patient denies headaches and dizziness.  Psychologic:   Patient reports depression and anxiety.    Notes: Patient does not currently have any kidney stone related pain or discomfort. No currently N&V    VITAL SIGNS:      05/10/2024 01:52 PM  Weight 208 lb / 94.35 kg  BP 132/81 mmHg  Pulse 83 /min  Temperature 97.9 F / 36.6 C   MULTI-SYSTEM PHYSICAL EXAMINATION:    Constitutional: Well-nourished. No physical deformities. Normally developed. Good grooming. No acute distress  Neurologic / Psychiatric: Oriented to time, oriented to place, oriented to person. No depression, no anxiety, no agitation.     Complexity of Data:  Lab Test Review:   BMP, CBC with Diff  Records Review:   Previous Hospital Records  X-Ray Review: C.T. Abdomen/Pelvis: Reviewed Films. Reviewed Report. Discussed With  Patient.    Notes:                     CLINICAL DATA: Mesenteric ischemia, acute   EXAM:  CTA ABDOMEN AND PELVIS WITHOUT AND WITH CONTRAST   TECHNIQUE:  Multidetector CT imaging of the abdomen and pelvis was performed  using the standard protocol during bolus administration of  intravenous contrast. Multiplanar reconstructed images and MIPs were  obtained and reviewed to evaluate the vascular anatomy.   RADIATION DOSE REDUCTION: This exam was performed according to the  departmental dose-optimization program which includes automated  exposure control, adjustment of the mA and/or kV according to  patient size and/or use of iterative reconstruction technique.   CONTRAST: 85mL OMNIPAQUE  IOHEXOL  350 MG/ML SOLN   COMPARISON: CT of the abdomen and pelvis performed September 16, 2022   FINDINGS:  VASCULAR   Aorta: Patent, no aneurysm.   Celiac: Patent.   SMA: Patent.   Renals: Patent.   IMA: Patent.   Inflow: Patent.   Proximal Outflow: Patent.   Veins: No obvious venous abnormality within the limitations of this  arterial phase study.   Review of the MIP images confirms the above findings.   NON-VASCULAR   Lower chest: No acute abnormality.   Hepatobiliary: Gallstones.   Pancreas: Nothing significant.   Spleen: Heterogeneous enhancement, possibly secondary to phase of  contrast agent acquisition.   Adrenals/Urinary Tract: The adrenal glands are grossly unremarkable.  A 9 mm stone is present the proximal left ureter. There is mild left  hydronephrosis as well as mild stranding surrounding the left renal  pelvis. Right kidney is within normal limits. The urinary bladder is  partially decompressed and grossly unremarkable.   Stomach/Bowel: Small hiatal hernia. No dilated loops of bowel are  seen. No area of suspicious wall thickening is appreciated.   Lymphatic: Nothing significant.   Reproductive: Uterus and bilateral adnexa are unremarkable.   Other:  Nothing significant.   Musculoskeletal: No acute or significant osseous findings.   IMPRESSION:  1. Patent visceral arteries without evidence of stenosis or  occlusion.  2. A 9 mm stone is present in the proximal left ureter with mild  inflammatory changes surrounding the proximal left renal collecting  system with mild left hydronephrosis.    Electronically Signed  By: Maude Naegeli M.D.  On: 05/07/2024 15:56     PROCEDURES:          Urinalysis w/Scope Dipstick Dipstick Cont'd Micro  Color: Yellow Bilirubin: Neg mg/dL WBC/hpf: NS (Not Seen)  Appearance: Clear Ketones: Neg mg/dL RBC/hpf: 3 - 89/yeq  Specific Gravity: 1.020 Blood: 3+ ery/uL Bacteria: Rare (0-9/hpf)  pH: 5.5 Protein: Trace mg/dL Cystals: NS (Not Seen)  Glucose: Neg mg/dL Urobilinogen: 0.2 mg/dL Casts: NS (Not Seen)    Nitrites: Neg Trichomonas: Not Present    Leukocyte Esterase: Neg leu/uL Mucous: Not Present      Epithelial Cells: 0 - 5/hpf      Yeast: NS (Not Seen)  Sperm: Not Present    ASSESSMENT:      ICD-10 Details  1 GU:   Flank Pain - R10.84 Undiagnosed New Problem  2   Ureteral calculus - N20.1 Undiagnosed New Problem  3   Ureteral obstruction secondary to calculous - N13.2 Undiagnosed New Problem   PLAN:           Schedule Return Visit/Planned Activity: Next Available Appointment - Schedule Surgery          Document Letter(s):  Created for Patient: Clinical Summary         Notes:   -The risks, benefits and alternatives of LEFT ESWL was discussed with the patient. I described the risks which include arrhythmia, kidney contusion, kidney hemorrhage, need for transfusion, back discomfort, flank ecchymosis, flank abrasion, inability to break up stone, inability to pass stone fragments, Steinstrasse, infection associated with obstructing stones, need for different surgical procedure and possible need for repeat shockwave lithotripsy. The patient voices understanding and wishes to proceed.   - We  discussed criteria to return to clinic or proceed to the ER which include: Fever/chills, worsening pain, nausea/vomiting and/or persistent gross hematuria.

## 2024-05-25 ENCOUNTER — Ambulatory Visit (HOSPITAL_COMMUNITY)

## 2024-05-25 ENCOUNTER — Encounter (HOSPITAL_COMMUNITY)
Admission: RE | Disposition: A | Discharge status: Home / Self Care | Payer: Self-pay | Source: Home / Self Care | Attending: Urology

## 2024-05-25 ENCOUNTER — Ambulatory Visit (HOSPITAL_COMMUNITY)
Admission: RE | Admit: 2024-05-25 | Discharge: 2024-05-25 | Disposition: A | Discharge status: Home / Self Care | Attending: Urology | Admitting: Urology

## 2024-05-25 ENCOUNTER — Encounter (HOSPITAL_COMMUNITY): Payer: Self-pay | Admitting: Urology

## 2024-05-25 ENCOUNTER — Other Ambulatory Visit: Payer: Self-pay

## 2024-05-25 DIAGNOSIS — I10 Essential (primary) hypertension: Secondary | ICD-10-CM | POA: Insufficient documentation

## 2024-05-25 DIAGNOSIS — Z8249 Family history of ischemic heart disease and other diseases of the circulatory system: Secondary | ICD-10-CM | POA: Insufficient documentation

## 2024-05-25 DIAGNOSIS — N132 Hydronephrosis with renal and ureteral calculous obstruction: Secondary | ICD-10-CM | POA: Insufficient documentation

## 2024-05-25 HISTORY — DX: Personal history of urinary calculi: Z87.442

## 2024-05-25 HISTORY — PX: EXTRACORPOREAL SHOCK WAVE LITHOTRIPSY: SHX1557

## 2024-05-25 SURGERY — LITHOTRIPSY, ESWL
Anesthesia: LOCAL | Laterality: Left

## 2024-05-25 MED ORDER — SODIUM CHLORIDE 0.9 % IV SOLN: INTRAVENOUS | Status: DC

## 2024-05-25 MED ORDER — DIAZEPAM 5 MG PO TABS
10.0000 mg | ORAL_TABLET | ORAL | Status: AC
  Administered 2024-05-25: 10 mg via ORAL
  Filled 2024-05-25: qty 2

## 2024-05-25 MED ORDER — BISACODYL 5 MG PO TBEC: 5.0000 mg | DELAYED_RELEASE_TABLET | Freq: Every day | ORAL | Status: DC | PRN

## 2024-05-25 MED ORDER — CIPROFLOXACIN HCL 500 MG PO TABS
500.0000 mg | ORAL_TABLET | ORAL | Status: AC
  Administered 2024-05-25: 500 mg via ORAL
  Filled 2024-05-25: qty 1

## 2024-05-25 MED ORDER — DIPHENHYDRAMINE HCL 25 MG PO CAPS
25.0000 mg | ORAL_CAPSULE | ORAL | Status: AC
  Administered 2024-05-25: 25 mg via ORAL
  Filled 2024-05-25: qty 1

## 2024-05-25 MED ORDER — SODIUM CHLORIDE 0.9% FLUSH: 3.0000 mL | Freq: Two times a day (BID) | INTRAVENOUS | Status: DC

## 2024-05-25 NOTE — Discharge Instructions (Signed)
 Moderate Conscious Sedation-Care After  This sheet gives you information about how to care for yourself after your procedure. Your health care provider may also give you more specific instructions. If you have problems or questions, contact your health care provider.  After the procedure, it is common to have: Sleepiness for several hours. Impaired judgment for several hours. Difficulty with balance. Vomiting if you eat too soon.  Follow these instructions at home:  Rest. Do not participate in activities where you could fall or become injured. Do not drive or use machinery. Do not drink alcohol. Do not take sleeping pills or medicines that cause drowsiness. Do not make important decisions or sign legal documents. Do not take care of children on your own.  Eating and drinking Follow the diet recommended by your health care provider. Drink enough fluid to keep your urine pale yellow. If you vomit: Drink water, juice, or soup when you can drink without vomiting. Make sure you have little or no nausea before eating solid foods.  General instructions Take over-the-counter and prescription medicines only as told by your health care provider. Have a responsible adult stay with you for the time you are told. It is important to have someone help care for you until you are awake and alert. Do not smoke. Keep all follow-up visits as told by your health care provider. This is important.  Contact a health care provider if: You are still sleepy or having trouble with balance after 24 hours. You feel light-headed. You keep feeling nauseous or you keep vomiting. You develop a rash. You have a fever. You have redness or swelling around the IV site.  Get help right away if: You have trouble breathing. You have new-onset confusion at home.  This information is not intended to replace advice given to you by your health care provider. Make sure you discuss any questions you have with your  healthcare provider.

## 2024-05-25 NOTE — Op Note (Signed)
 See [facility] scanned note

## 2024-05-28 ENCOUNTER — Encounter (HOSPITAL_COMMUNITY): Payer: Self-pay | Admitting: Urology

## 2024-08-16 ENCOUNTER — Other Ambulatory Visit: Payer: Self-pay | Admitting: Family Medicine

## 2024-09-24 ENCOUNTER — Other Ambulatory Visit: Payer: Self-pay | Admitting: Family Medicine

## 2024-11-06 ENCOUNTER — Other Ambulatory Visit: Payer: Self-pay | Admitting: Family Medicine

## 2024-11-28 ENCOUNTER — Other Ambulatory Visit: Payer: Self-pay | Admitting: Family Medicine
